# Patient Record
Sex: Female | Born: 1945
Health system: Southern US, Community
[De-identification: ages and names within clinical notes are randomized; demographics above are authoritative.]

## PROBLEM LIST (undated history)

## (undated) DIAGNOSIS — E039 Hypothyroidism, unspecified: Secondary | ICD-10-CM

## (undated) DIAGNOSIS — C801 Malignant (primary) neoplasm, unspecified: Secondary | ICD-10-CM

## (undated) DIAGNOSIS — M1990 Unspecified osteoarthritis, unspecified site: Secondary | ICD-10-CM

## (undated) DIAGNOSIS — Z87442 Personal history of urinary calculi: Secondary | ICD-10-CM

## (undated) DIAGNOSIS — F32A Depression, unspecified: Secondary | ICD-10-CM

## (undated) DIAGNOSIS — M810 Age-related osteoporosis without current pathological fracture: Secondary | ICD-10-CM

## (undated) DIAGNOSIS — D649 Anemia, unspecified: Secondary | ICD-10-CM

## (undated) DIAGNOSIS — C4490 Unspecified malignant neoplasm of skin, unspecified: Secondary | ICD-10-CM

## (undated) DIAGNOSIS — K219 Gastro-esophageal reflux disease without esophagitis: Secondary | ICD-10-CM

## (undated) DIAGNOSIS — Z800 Family history of malignant neoplasm of digestive organs: Secondary | ICD-10-CM

## (undated) DIAGNOSIS — G43909 Migraine, unspecified, not intractable, without status migrainosus: Secondary | ICD-10-CM

## (undated) DIAGNOSIS — L900 Lichen sclerosus et atrophicus: Secondary | ICD-10-CM

## (undated) DIAGNOSIS — F329 Major depressive disorder, single episode, unspecified: Secondary | ICD-10-CM

## (undated) DIAGNOSIS — T7840XA Allergy, unspecified, initial encounter: Secondary | ICD-10-CM

## (undated) DIAGNOSIS — E7800 Pure hypercholesterolemia, unspecified: Secondary | ICD-10-CM

## (undated) DIAGNOSIS — D126 Benign neoplasm of colon, unspecified: Secondary | ICD-10-CM

## (undated) DIAGNOSIS — B009 Herpesviral infection, unspecified: Secondary | ICD-10-CM

## (undated) DIAGNOSIS — Z803 Family history of malignant neoplasm of breast: Secondary | ICD-10-CM

## (undated) DIAGNOSIS — J189 Pneumonia, unspecified organism: Secondary | ICD-10-CM

## (undated) DIAGNOSIS — Z8042 Family history of malignant neoplasm of prostate: Secondary | ICD-10-CM

## (undated) DIAGNOSIS — Z85820 Personal history of malignant melanoma of skin: Secondary | ICD-10-CM

## (undated) DIAGNOSIS — M8580 Other specified disorders of bone density and structure, unspecified site: Secondary | ICD-10-CM

## (undated) DIAGNOSIS — F419 Anxiety disorder, unspecified: Secondary | ICD-10-CM

## (undated) HISTORY — PX: MELANOMA EXCISION WITH SENTINEL LYMPH NODE BIOPSY: SHX5267

## (undated) HISTORY — DX: Personal history of urinary calculi: Z87.442

## (undated) HISTORY — DX: Anxiety disorder, unspecified: F41.9

## (undated) HISTORY — PX: OTHER SURGICAL HISTORY: SHX169

## (undated) HISTORY — DX: Lichen sclerosus et atrophicus: L90.0

## (undated) HISTORY — DX: Anemia, unspecified: D64.9

## (undated) HISTORY — DX: Allergy, unspecified, initial encounter: T78.40XA

## (undated) HISTORY — DX: Pure hypercholesterolemia, unspecified: E78.00

## (undated) HISTORY — DX: Family history of malignant neoplasm of digestive organs: Z80.0

## (undated) HISTORY — DX: Herpesviral infection, unspecified: B00.9

## (undated) HISTORY — DX: Unspecified malignant neoplasm of skin, unspecified: C44.90

## (undated) HISTORY — DX: Pneumonia, unspecified organism: J18.9

## (undated) HISTORY — DX: Family history of malignant neoplasm of breast: Z80.3

## (undated) HISTORY — DX: Gastro-esophageal reflux disease without esophagitis: K21.9

## (undated) HISTORY — DX: Family history of malignant neoplasm of prostate: Z80.42

## (undated) HISTORY — DX: Hypothyroidism, unspecified: E03.9

## (undated) HISTORY — DX: Migraine, unspecified, not intractable, without status migrainosus: G43.909

## (undated) HISTORY — PX: BREAST EXCISIONAL BIOPSY: SUR124

## (undated) HISTORY — DX: Malignant (primary) neoplasm, unspecified: C80.1

## (undated) HISTORY — DX: Depression, unspecified: F32.A

## (undated) HISTORY — DX: Major depressive disorder, single episode, unspecified: F32.9

## (undated) HISTORY — DX: Personal history of malignant melanoma of skin: Z85.820

## (undated) HISTORY — DX: Unspecified osteoarthritis, unspecified site: M19.90

## (undated) HISTORY — DX: Benign neoplasm of colon, unspecified: D12.6

## (undated) HISTORY — DX: Age-related osteoporosis without current pathological fracture: M81.0

## (undated) HISTORY — DX: Other specified disorders of bone density and structure, unspecified site: M85.80

---

## 1988-06-22 HISTORY — PX: VAGINAL HYSTERECTOMY: SUR661

## 1997-11-21 ENCOUNTER — Other Ambulatory Visit: Admission: RE | Admit: 1997-11-21 | Discharge: 1997-11-21 | Payer: Self-pay | Admitting: Obstetrics and Gynecology

## 1999-05-06 ENCOUNTER — Other Ambulatory Visit: Admission: RE | Admit: 1999-05-06 | Discharge: 1999-05-06 | Payer: Self-pay | Admitting: Obstetrics and Gynecology

## 1999-11-17 ENCOUNTER — Encounter: Payer: Self-pay | Admitting: Obstetrics and Gynecology

## 1999-11-17 ENCOUNTER — Encounter: Admission: RE | Admit: 1999-11-17 | Discharge: 1999-11-17 | Payer: Self-pay | Admitting: Obstetrics and Gynecology

## 1999-12-01 ENCOUNTER — Encounter: Admission: RE | Admit: 1999-12-01 | Discharge: 1999-12-01 | Payer: Self-pay | Admitting: Obstetrics and Gynecology

## 2000-07-21 ENCOUNTER — Other Ambulatory Visit: Admission: RE | Admit: 2000-07-21 | Discharge: 2000-07-21 | Payer: Self-pay | Admitting: Obstetrics and Gynecology

## 2000-08-06 ENCOUNTER — Encounter: Admission: RE | Admit: 2000-08-06 | Discharge: 2000-08-06 | Payer: Self-pay | Admitting: Obstetrics and Gynecology

## 2000-08-06 ENCOUNTER — Encounter: Payer: Self-pay | Admitting: Obstetrics and Gynecology

## 2000-09-27 ENCOUNTER — Ambulatory Visit (HOSPITAL_COMMUNITY): Admission: RE | Admit: 2000-09-27 | Discharge: 2000-09-27 | Payer: Self-pay

## 2000-09-27 ENCOUNTER — Encounter: Payer: Self-pay | Admitting: Emergency Medicine

## 2001-03-11 ENCOUNTER — Encounter: Payer: Self-pay | Admitting: Obstetrics and Gynecology

## 2001-03-11 ENCOUNTER — Encounter: Admission: RE | Admit: 2001-03-11 | Discharge: 2001-03-11 | Payer: Self-pay | Admitting: Obstetrics and Gynecology

## 2001-07-21 ENCOUNTER — Other Ambulatory Visit: Admission: RE | Admit: 2001-07-21 | Discharge: 2001-07-21 | Payer: Self-pay | Admitting: Obstetrics and Gynecology

## 2002-07-25 ENCOUNTER — Other Ambulatory Visit: Admission: RE | Admit: 2002-07-25 | Discharge: 2002-07-25 | Payer: Self-pay | Admitting: Obstetrics and Gynecology

## 2002-08-14 ENCOUNTER — Encounter: Admission: RE | Admit: 2002-08-14 | Discharge: 2002-08-14 | Payer: Self-pay | Admitting: Obstetrics and Gynecology

## 2002-08-14 ENCOUNTER — Encounter: Payer: Self-pay | Admitting: Obstetrics and Gynecology

## 2002-08-17 ENCOUNTER — Encounter: Admission: RE | Admit: 2002-08-17 | Discharge: 2002-08-17 | Payer: Self-pay | Admitting: Obstetrics and Gynecology

## 2002-08-17 ENCOUNTER — Encounter: Payer: Self-pay | Admitting: Obstetrics and Gynecology

## 2003-08-03 ENCOUNTER — Other Ambulatory Visit: Admission: RE | Admit: 2003-08-03 | Discharge: 2003-08-03 | Payer: Self-pay | Admitting: Obstetrics and Gynecology

## 2004-02-12 ENCOUNTER — Encounter: Admission: RE | Admit: 2004-02-12 | Discharge: 2004-02-12 | Payer: Self-pay | Admitting: Obstetrics and Gynecology

## 2004-08-25 ENCOUNTER — Other Ambulatory Visit: Admission: RE | Admit: 2004-08-25 | Discharge: 2004-08-25 | Payer: Self-pay | Admitting: Addiction Medicine

## 2004-09-04 ENCOUNTER — Encounter: Admission: RE | Admit: 2004-09-04 | Discharge: 2004-09-04 | Payer: Self-pay | Admitting: Obstetrics and Gynecology

## 2005-08-21 ENCOUNTER — Encounter: Admission: RE | Admit: 2005-08-21 | Discharge: 2005-08-21 | Payer: Self-pay | Admitting: Family Medicine

## 2005-10-29 ENCOUNTER — Other Ambulatory Visit: Admission: RE | Admit: 2005-10-29 | Discharge: 2005-10-29 | Payer: Self-pay | Admitting: Obstetrics and Gynecology

## 2005-12-09 ENCOUNTER — Encounter: Admission: RE | Admit: 2005-12-09 | Discharge: 2005-12-09 | Payer: Self-pay | Admitting: Obstetrics and Gynecology

## 2005-12-22 ENCOUNTER — Encounter: Admission: RE | Admit: 2005-12-22 | Discharge: 2005-12-22 | Payer: Self-pay | Admitting: Obstetrics and Gynecology

## 2006-12-02 ENCOUNTER — Other Ambulatory Visit: Admission: RE | Admit: 2006-12-02 | Discharge: 2006-12-02 | Payer: Self-pay | Admitting: Obstetrics and Gynecology

## 2007-02-14 ENCOUNTER — Encounter: Admission: RE | Admit: 2007-02-14 | Discharge: 2007-02-14 | Payer: Self-pay | Admitting: Obstetrics and Gynecology

## 2007-12-06 ENCOUNTER — Other Ambulatory Visit: Admission: RE | Admit: 2007-12-06 | Discharge: 2007-12-06 | Payer: Self-pay | Admitting: Obstetrics and Gynecology

## 2007-12-29 ENCOUNTER — Ambulatory Visit: Payer: Self-pay | Admitting: Gastroenterology

## 2008-01-12 ENCOUNTER — Ambulatory Visit: Payer: Self-pay | Admitting: Gastroenterology

## 2008-01-12 ENCOUNTER — Encounter: Payer: Self-pay | Admitting: Gastroenterology

## 2008-01-13 ENCOUNTER — Encounter: Payer: Self-pay | Admitting: Gastroenterology

## 2008-02-24 ENCOUNTER — Encounter: Admission: RE | Admit: 2008-02-24 | Discharge: 2008-02-24 | Payer: Self-pay | Admitting: Obstetrics and Gynecology

## 2008-12-10 ENCOUNTER — Encounter: Payer: Self-pay | Admitting: Obstetrics and Gynecology

## 2008-12-10 ENCOUNTER — Ambulatory Visit: Payer: Self-pay | Admitting: Obstetrics and Gynecology

## 2008-12-10 ENCOUNTER — Other Ambulatory Visit: Admission: RE | Admit: 2008-12-10 | Discharge: 2008-12-10 | Payer: Self-pay | Admitting: Obstetrics and Gynecology

## 2009-04-12 ENCOUNTER — Encounter: Admission: RE | Admit: 2009-04-12 | Discharge: 2009-04-12 | Payer: Self-pay | Admitting: Obstetrics and Gynecology

## 2010-05-21 ENCOUNTER — Encounter: Admission: RE | Admit: 2010-05-21 | Discharge: 2010-05-21 | Payer: Self-pay | Admitting: Obstetrics and Gynecology

## 2010-06-04 ENCOUNTER — Other Ambulatory Visit
Admission: RE | Admit: 2010-06-04 | Discharge: 2010-06-04 | Payer: Self-pay | Source: Home / Self Care | Admitting: Obstetrics and Gynecology

## 2010-06-04 ENCOUNTER — Ambulatory Visit: Payer: Self-pay | Admitting: Obstetrics and Gynecology

## 2010-06-09 ENCOUNTER — Encounter
Admission: RE | Admit: 2010-06-09 | Discharge: 2010-06-09 | Payer: Self-pay | Source: Home / Self Care | Attending: Obstetrics and Gynecology | Admitting: Obstetrics and Gynecology

## 2010-07-13 ENCOUNTER — Encounter: Payer: Self-pay | Admitting: Obstetrics and Gynecology

## 2011-04-14 ENCOUNTER — Other Ambulatory Visit: Payer: Self-pay | Admitting: Obstetrics and Gynecology

## 2011-04-14 DIAGNOSIS — Z1231 Encounter for screening mammogram for malignant neoplasm of breast: Secondary | ICD-10-CM

## 2011-05-20 ENCOUNTER — Encounter: Payer: Self-pay | Admitting: *Deleted

## 2011-05-20 DIAGNOSIS — E039 Hypothyroidism, unspecified: Secondary | ICD-10-CM | POA: Insufficient documentation

## 2011-05-20 DIAGNOSIS — E7800 Pure hypercholesterolemia, unspecified: Secondary | ICD-10-CM | POA: Insufficient documentation

## 2011-05-20 DIAGNOSIS — L900 Lichen sclerosus et atrophicus: Secondary | ICD-10-CM | POA: Insufficient documentation

## 2011-05-20 DIAGNOSIS — M8580 Other specified disorders of bone density and structure, unspecified site: Secondary | ICD-10-CM | POA: Insufficient documentation

## 2011-05-25 ENCOUNTER — Ambulatory Visit
Admission: RE | Admit: 2011-05-25 | Discharge: 2011-05-25 | Disposition: A | Discharge status: Home / Self Care | Payer: BC Managed Care – PPO | Source: Ambulatory Visit | Attending: Obstetrics and Gynecology | Admitting: Obstetrics and Gynecology

## 2011-05-25 DIAGNOSIS — Z1231 Encounter for screening mammogram for malignant neoplasm of breast: Secondary | ICD-10-CM

## 2011-06-09 ENCOUNTER — Ambulatory Visit (INDEPENDENT_AMBULATORY_CARE_PROVIDER_SITE_OTHER): Payer: BC Managed Care – PPO | Admitting: Obstetrics and Gynecology

## 2011-06-09 ENCOUNTER — Encounter: Payer: Self-pay | Admitting: Obstetrics and Gynecology

## 2011-06-09 ENCOUNTER — Other Ambulatory Visit (HOSPITAL_COMMUNITY)
Admission: RE | Admit: 2011-06-09 | Discharge: 2011-06-09 | Disposition: A | Discharge status: Home / Self Care | Payer: BC Managed Care – PPO | Source: Ambulatory Visit | Attending: Obstetrics and Gynecology | Admitting: Obstetrics and Gynecology

## 2011-06-09 VITALS — BP 120/74 | Ht 59.0 in | Wt 106.0 lb

## 2011-06-09 DIAGNOSIS — Z01419 Encounter for gynecological examination (general) (routine) without abnormal findings: Secondary | ICD-10-CM | POA: Insufficient documentation

## 2011-06-09 MED ORDER — ESTRADIOL 1 MG PO TABS: 1.0000 mg | ORAL_TABLET | Freq: Every day | ORAL | Status: DC

## 2011-06-09 NOTE — Progress Notes (Signed)
Patient came to see me today for her annual GYN exam. She remains on oral estradiol with excellent results. She has minimal bone loss on bone density. She remains on calcium and vitamin D. She is up-to-date on her mammograms. She is having no vaginal bleeding or pelvic pain. She has noticed recently occasional dizziness when she's in bed and some fatigue. She also has a mark where a mole was removed from her right breast by a dermatologist with a negative report. She does her lab through her PCP.  HEENT: Within normal limits.  Kennon Portela present. Neck: No masses. Supraclavicular lymph nodes: Not enlarged. Breasts: Examined in both sitting and lying position. Symmetrical without skin changes or masses. Next to a scar from a mole excision there is a red raised lesion which looks like a keloid. It is less than half a centimeter. Abdomen: Soft no masses guarding or rebound. No hernias. Pelvic: External within normal limits. BUS within normal limits.there is now just minimal lichen sclerosis present. vaginal examination shows good estrogen effect, no cystocele enterocele or rectocele. Cervix and uterus absent. Adnexa within normal limits. Rectovaginal confirmatory. Extremities within normal limits.  Assessment: Menopausal symptoms. Lichen sclerosis much improved. Osteopenia. Probable keloid from all excision.  Plan: Continue estradiol 1.5 mg daily. Continuing mammograms. Refer to Dr. Venancio Poisson for further dermatological care. Prefer to Dr. Shon Hough for possible excision of keloid.

## 2011-10-23 ENCOUNTER — Other Ambulatory Visit: Payer: Self-pay | Admitting: Obstetrics and Gynecology

## 2012-04-19 ENCOUNTER — Other Ambulatory Visit: Payer: Self-pay | Admitting: Obstetrics and Gynecology

## 2012-04-19 DIAGNOSIS — Z1231 Encounter for screening mammogram for malignant neoplasm of breast: Secondary | ICD-10-CM

## 2012-05-23 ENCOUNTER — Ambulatory Visit
Admission: RE | Admit: 2012-05-23 | Discharge: 2012-05-23 | Disposition: A | Discharge status: Home / Self Care | Payer: Managed Care, Other (non HMO) | Source: Ambulatory Visit | Attending: Obstetrics and Gynecology | Admitting: Obstetrics and Gynecology

## 2012-05-23 DIAGNOSIS — Z1231 Encounter for screening mammogram for malignant neoplasm of breast: Secondary | ICD-10-CM

## 2012-06-16 ENCOUNTER — Ambulatory Visit (INDEPENDENT_AMBULATORY_CARE_PROVIDER_SITE_OTHER): Payer: Managed Care, Other (non HMO) | Admitting: Obstetrics and Gynecology

## 2012-06-16 ENCOUNTER — Encounter: Payer: Self-pay | Admitting: Obstetrics and Gynecology

## 2012-06-16 VITALS — BP 122/72 | Ht 59.0 in | Wt 106.0 lb

## 2012-06-16 DIAGNOSIS — Z01419 Encounter for gynecological examination (general) (routine) without abnormal findings: Secondary | ICD-10-CM

## 2012-06-16 MED ORDER — ESTRADIOL 1 MG PO TABS: 1.0000 mg | ORAL_TABLET | Freq: Every day | ORAL | Status: DC

## 2012-06-16 MED ORDER — VITAMIN D (ERGOCALCIFEROL) 1.25 MG (50000 UNIT) PO CAPS: ORAL_CAPSULE | ORAL | Status: DC

## 2012-06-16 NOTE — Patient Instructions (Signed)
Get a 3-D mammogram next year. Get a bone density next year.

## 2012-06-16 NOTE — Progress Notes (Signed)
Patient came to see me today for her annual GYN exam. She remains on oral estradiol 1.5 mg daily. She has  Tried  to taper this unsuccessfully. She has no menopausal symptoms or atrophic vaginitis on the above. She is up-to-date on mammograms. She has a strong family history of breast cancer. She has osteopenia on bone density which is stable. Her last bone density was 2011. She has a long-term history of lichen sclerosis. She uses Temovate but now very infrequently. She had a vaginal hysterectomy in 1990 for adenomyosis. She has always had normal Pap smears. Her last Pap smear was 2012. She takes vitamin D 50,000 IUs every other week.  HEENT: Within normal limits.Kennon Portela present. Neck: No masses. Supraclavicular lymph nodes: Not enlarged. Breasts: Examined in both sitting and lying position. Symmetrical without skin changes or masses. Abdomen: Soft no masses guarding or rebound. No hernias. Pelvic: External within normal limits with minimal lichens sclerosis of labia and perineum-better than before. BUS within normal limits. Vaginal examination shows good estrogen effect, no cystocele enterocele or rectocele. Cervix and uterus absent. Adnexa within normal limits. Rectovaginal confirmatory. Extremities within normal limits.  Assessment: #1. Lichen sclerosis. #2. Menopausal symptoms. #3. Osteopenia  Plan: Continue yearly mammograms. Bone density next year. Continue vitamin D. Patient to get Dr. Lenise Arena to recheck her vitamin D level. Encourage patient to switch to estrogen patch. She declined. Continue oral  Estradiol. Pap not done. The new Pap smear guidelines were discussed with the patient.

## 2012-06-16 NOTE — Addendum Note (Signed)
Addended by: Dayna Barker on: 06/16/2012 10:26 AM   Modules accepted: Orders

## 2012-06-17 LAB — URINALYSIS W MICROSCOPIC + REFLEX CULTURE
Bilirubin Urine: NEGATIVE
Hgb urine dipstick: NEGATIVE
Ketones, ur: NEGATIVE mg/dL
Protein, ur: NEGATIVE mg/dL
Specific Gravity, Urine: 1.007 (ref 1.005–1.030)

## 2013-01-04 ENCOUNTER — Encounter: Payer: Self-pay | Admitting: Gastroenterology

## 2013-01-20 DIAGNOSIS — D126 Benign neoplasm of colon, unspecified: Secondary | ICD-10-CM

## 2013-01-20 HISTORY — DX: Benign neoplasm of colon, unspecified: D12.6

## 2013-01-25 ENCOUNTER — Ambulatory Visit (AMBULATORY_SURGERY_CENTER): Payer: Medicare Other

## 2013-01-25 VITALS — Ht 59.5 in | Wt 109.0 lb

## 2013-01-25 DIAGNOSIS — Z860100 Personal history of colon polyps, unspecified: Secondary | ICD-10-CM

## 2013-01-25 MED ORDER — MOVIPREP 100 G PO SOLR: 1.0000 | Freq: Once | ORAL | Status: DC

## 2013-01-26 ENCOUNTER — Encounter: Payer: Self-pay | Admitting: Gastroenterology

## 2013-02-02 ENCOUNTER — Encounter: Payer: Self-pay | Admitting: Gastroenterology

## 2013-02-02 ENCOUNTER — Ambulatory Visit (AMBULATORY_SURGERY_CENTER): Payer: Medicare Other | Admitting: Gastroenterology

## 2013-02-02 VITALS — BP 129/57 | HR 74 | Temp 99.3°F | Resp 15 | Ht 59.5 in | Wt 109.0 lb

## 2013-02-02 DIAGNOSIS — Z800 Family history of malignant neoplasm of digestive organs: Secondary | ICD-10-CM

## 2013-02-02 DIAGNOSIS — D126 Benign neoplasm of colon, unspecified: Secondary | ICD-10-CM

## 2013-02-02 DIAGNOSIS — Z860100 Personal history of colon polyps, unspecified: Secondary | ICD-10-CM

## 2013-02-02 MED ORDER — SODIUM CHLORIDE 0.9 % IV SOLN: 500.0000 mL | INTRAVENOUS | Status: DC

## 2013-02-02 NOTE — Progress Notes (Signed)
Called to room to assist during endoscopic procedure.  Patient ID and intended procedure confirmed with present staff. Received instructions for my participation in the procedure from the performing physician. ewm 

## 2013-02-02 NOTE — Patient Instructions (Addendum)
YOU HAD AN ENDOSCOPIC PROCEDURE TODAY AT THE [facility]: Refer to the procedure report that was given to you for any specific questions about what was found during the examination.  If the procedure report does not answer your questions, please call your gastroenterologist to clarify.  If you requested that your care partner not be given the details of your procedure findings, then the procedure report has been included in a sealed envelope for you to review at your convenience later.  YOU SHOULD EXPECT: Some feelings of bloating in the abdomen. Passage of more gas than usual.  Walking can help get rid of the air that was put into your GI tract during the procedure and reduce the bloating. If you had a lower endoscopy (such as a colonoscopy or flexible sigmoidoscopy) you may notice spotting of blood in your stool or on the toilet paper. If you underwent a bowel prep for your procedure, then you may not have a normal bowel movement for a few days.  DIET: Your first meal following the procedure should be a light meal and then it is ok to progress to your normal diet.  A half-sandwich or bowl of soup is an example of a good first meal.  Heavy or fried foods are harder to digest and may make you feel nauseous or bloated.  Likewise meals heavy in dairy and vegetables can cause extra gas to form and this can also increase the bloating.  Drink plenty of fluids but you should avoid alcoholic beverages for 24 hours.  ACTIVITY: Your care partner should take you home directly after the procedure.  You should plan to take it easy, moving slowly for the rest of the day.  You can resume normal activity the day after the procedure however you should NOT DRIVE or use heavy machinery for 24 hours (because of the sedation medicines used during the test).    SYMPTOMS TO REPORT IMMEDIATELY: A gastroenterologist can be reached at any hour.  During normal business hours, 8:30 AM to 5:00 PM Monday through Friday,  call [phone].  After hours and on weekends, please call the GI answering service at [phone] who will take a message and have the physician on call contact you.   Following lower endoscopy (colonoscopy or flexible sigmoidoscopy):  Excessive amounts of blood in the stool  Significant tenderness or worsening of abdominal pains  Swelling of the abdomen that is new, acute  Fever of 100F or higher    FOLLOW UP: If any biopsies were taken you will be contacted by phone or by letter within the next 1-3 weeks.  Call your gastroenterologist if you have not heard about the biopsies in 3 weeks.  Our staff will call the home number listed on your records the next business day following your procedure to check on you and address any questions or concerns that you may have at that time regarding the information given to you following your procedure. This is a courtesy call and so if there is no answer at the home number and we have not heard from you through the emergency physician on call, we will assume that you have returned to your regular daily activities without incident.  SIGNATURES/CONFIDENTIALITY: You and/or your care partner have signed paperwork which will be entered into your electronic medical record.  These signatures attest to the fact that that the information above on your After Visit Summary has been reviewed and is understood.  Full responsibility of the confidentiality   of this discharge information lies with you and/or your care-partner.   Handouts were given to your care partner on polyps, hemorrhoids and a high fiber diet with liberal fluid intake. You may resume your current medications today. Please call if any questions of concerns.

## 2013-02-02 NOTE — Progress Notes (Signed)
Lidocaine-40mg IV prior to Propofol InductionPropofol given over incremental dosages 

## 2013-02-02 NOTE — Op Note (Signed)
[facility] [address] [place], [address]   COLONOSCOPY PROCEDURE REPORT  PATIENT: Carla Alvarado, Carla Alvarado  MR#: [id number] BIRTHDATE: 04/09/1946 , 67  yrs. old GENDER: Female ENDOSCOPIST: Meryl Dare, MD, [facility] PROCEDURE DATE:  02/02/2013 PROCEDURE:   Colonoscopy with biopsy and snare polypectomy First Screening Colonoscopy - Avg.  risk and is 50 yrs.  old or older - No.  Prior Negative Screening - Now for repeat screening. N/A  History of Adenoma - Now for follow-up colonoscopy & has been > or = to 3 yrs.  Yes hx of adenoma.  Has been 3 or more years since last colonoscopy.  Polyps Removed Today? Yes. ASA CLASS:   Class II INDICATIONS:Patient's personal history of adenomatous colon polyps and Patient's immediate family history of colon cancer. MEDICATIONS: MAC sedation, administered by CRNA and propofol (Diprivan) 200mg  IV DESCRIPTION OF PROCEDURE:   After the risks benefits and alternatives of the procedure were thoroughly explained, informed consent was obtained.  A digital rectal exam revealed no abnormalities of the rectum.   The LB [id number]  endoscope was introduced through the anus and advanced to the cecum, which was identified by both the appendix and ileocecal valve. No adverse events experienced.   The quality of the prep was excellent, using MoviPrep  The instrument was then slowly withdrawn as the colon was fully examined.  COLON FINDINGS: A sessile polyp measuring 6 mm in size was found in the descending colon.  A polypectomy was performed with a cold snare.  The resection was complete and the polyp tissue was completely retrieved.  Two sessile polyps measuring 4 mm in size were found in the sigmoid colon.  A polypectomy was performed with cold forceps.  The resection was complete and the polyp tissue was completely retrieved.  The colon was otherwise normal. There was no diverticulosis, inflammation, polyps or cancers unless  previously stated. Retroflexed views revealed small internal hemorrhoids. The time to cecum=1 minutes 45 seconds. Withdrawal time=12 minutes 00 seconds.  The scope was withdrawn and the procedure completed. COMPLICATIONS: There were no complications.  ENDOSCOPIC IMPRESSION: 1.   Sessile polyp measuring 6 mm in the descending colon; polypectomy performed with a cold snare 2.   Two sessile polyps measuring 4 mm in the sigmoid colon; polypectomy performed with cold forceps 3.   Small internal hemorrhoids  RECOMMENDATIONS: 1.  Await pathology results 2.  Repeat Colonoscopy in 5 years.  eSigned:  Meryl Dare, MD, [facility] 02/02/2013 3:13 PM   cc: Joycelyn Rua, MD

## 2013-02-02 NOTE — Progress Notes (Signed)
Patient did not experience any of the following events: a burn prior to discharge; a fall within the facility; wrong site/side/patient/procedure/implant event; or a hospital transfer or hospital admission upon discharge from the facility. ([id number])Patient did not have preoperative order for IV antibiotic SSI prophylaxis. [phone])   No complaints noted in the recovery room. Maw

## 2013-02-03 ENCOUNTER — Telehealth: Payer: Self-pay

## 2013-02-03 NOTE — Telephone Encounter (Signed)
  Follow up Call-  Call back number 02/02/2013  Post procedure Call Back phone  # [phone]  Permission to leave phone message Yes     Patient questions:  Do you have a fever, pain , or abdominal swelling? no Pain Score  0 *  Have you tolerated food without any problems? yes  Have you been able to return to your normal activities? yes  Do you have any questions about your discharge instructions: Diet   no Medications  no Follow up visit  no  Do you have questions or concerns about your Care? no  Actions: * If pain score is 4 or above: No action needed, pain <4.

## 2013-02-07 ENCOUNTER — Encounter: Payer: Self-pay | Admitting: Gastroenterology

## 2013-04-19 ENCOUNTER — Other Ambulatory Visit: Payer: Self-pay

## 2013-04-19 DIAGNOSIS — Z803 Family history of malignant neoplasm of breast: Secondary | ICD-10-CM

## 2013-04-19 DIAGNOSIS — Z1231 Encounter for screening mammogram for malignant neoplasm of breast: Secondary | ICD-10-CM

## 2013-05-25 ENCOUNTER — Ambulatory Visit: Payer: Medicare Other

## 2013-05-30 ENCOUNTER — Ambulatory Visit
Admission: RE | Admit: 2013-05-30 | Discharge: 2013-05-30 | Disposition: A | Discharge status: Home / Self Care | Payer: Medicare Other | Source: Ambulatory Visit

## 2013-05-30 DIAGNOSIS — Z803 Family history of malignant neoplasm of breast: Secondary | ICD-10-CM

## 2013-05-30 DIAGNOSIS — Z1231 Encounter for screening mammogram for malignant neoplasm of breast: Secondary | ICD-10-CM

## 2013-06-19 ENCOUNTER — Ambulatory Visit (INDEPENDENT_AMBULATORY_CARE_PROVIDER_SITE_OTHER): Payer: Medicare Other | Admitting: Gynecology

## 2013-06-19 ENCOUNTER — Encounter: Payer: Self-pay | Admitting: Gynecology

## 2013-06-19 VITALS — BP 112/62 | Ht 59.5 in | Wt 112.2 lb

## 2013-06-19 DIAGNOSIS — Z79890 Hormone replacement therapy (postmenopausal): Secondary | ICD-10-CM

## 2013-06-19 DIAGNOSIS — L900 Lichen sclerosus et atrophicus: Secondary | ICD-10-CM

## 2013-06-19 DIAGNOSIS — M899 Disorder of bone, unspecified: Secondary | ICD-10-CM

## 2013-06-19 DIAGNOSIS — L940 Localized scleroderma [morphea]: Secondary | ICD-10-CM

## 2013-06-19 DIAGNOSIS — N952 Postmenopausal atrophic vaginitis: Secondary | ICD-10-CM

## 2013-06-19 DIAGNOSIS — M8580 Other specified disorders of bone density and structure, unspecified site: Secondary | ICD-10-CM

## 2013-06-19 MED ORDER — ESTRADIOL 1 MG PO TABS: 1.0000 mg | ORAL_TABLET | Freq: Every day | ORAL | Status: DC

## 2013-06-19 NOTE — Progress Notes (Signed)
Carla Alvarado [birth year] [id number]        67 y.o.  [id number] for followup exam.  Former patient of Dr. Eda Paschal. Several issues noted below.   Past medical history,surgical history, problem list, medications, allergies, family history and social history were all reviewed and documented in the EPIC chart.  ROS:  Performed and pertinent positives and negatives are included in the history, assessment and plan .  Exam: Carla Alvarado assistant Filed Vitals:   06/19/13 0846  BP: 112/62  Height: 4' 11.5" (1.511 m)  Weight: 112 lb 3.2 oz (50.894 kg)   General appearance  Normal Skin grossly normal Head/Neck normal with no cervical or supraclavicular adenopathy thyroid normal Lungs  clear Cardiac RR, without RMG Abdominal  soft, nontender, without masses, organomegaly or hernia Breasts  examined lying and sitting without masses, retractions, discharge or axillary adenopathy. Pelvic  Ext/BUS/vagina  with symmetrical hypopigmentation from periclitoral to perianal region consistent with lichen sclerosus. Generalized atrophic changes.  Adnexa  Without masses or tenderness    Anus and perineum  Normal   Rectovaginal  Normal sphincter tone without palpated masses or tenderness.    Assessment/Plan:  67 y.o. [id number] female for annual exam.   1. Status post TVH in 1990 for adenomyosis. On estradiol 1.5 mg daily. Started for menopausal symptoms. Had tried to wean but had unacceptable hot flushes and night sweats.  I reviewed the whole issue of HRT with her to include the WHI study with increased risk of stroke, heart attack, DVT and breast cancer. The ACOG and NAMS statements for lowest dose for the shortest period of time reviewed. Transdermal versus oral first-pass effect benefit discussed.  After lengthy discussion the patient wants to continue. I did recommend going to 1 mg daily to see if she tolerates this and ultimately try to wean slowly. Patient agrees to do so. I refilled her ERT x1 year. 2. Lichen  sclerosus. Documented in Dr. Verl Dicker note. Exam consistent with this. Had used Temovate in the past but no longer requires this for symptom control. Will call if become symptomatic. 3. Osteopenia. DEXA 2011 with T score -1.3. Recommend repeat DEXA now she agrees to schedule. Had been on vitamin D 50,000 units every other week per Dr. Eda Paschal but she does admit to not taking regularly. Has not had vitamin D level checked for a long time. Check vitamin D level today. Will recommend supplement based on this. Did not refill her 50,000 units prescription. 4. Pap smear 2012. No Pap smear done today. No history of significant abnormal Pap smears. Age 89. Hysterectomy for benign indications. Options to stop screening altogether or less frequent screening intervals reviewed. We'll readdress on an annual basis. 5. Mammography 05/2013. Continue with annual mammography. SBE monthly reviewed. 6. Colonoscopy 01/2013. Repeat that they're recommend that interval. 7. Health maintenance. No routine blood work done this this is all done through her primary physician's office who follows her for her medical issues. Followup one year, sooner as needed.   Note: This document was prepared with digital dictation and possible smart phrase technology. Any transcriptional errors that result from this process are unintentional.   Carla Lords MD, 9:21 AM 06/19/2013

## 2013-06-19 NOTE — Patient Instructions (Signed)
Followup for bone density as scheduled. Followup in one year, sooner as needed.

## 2013-06-20 LAB — URINALYSIS W MICROSCOPIC + REFLEX CULTURE
Bilirubin Urine: NEGATIVE
Leukocytes, UA: NEGATIVE
Nitrite: NEGATIVE
Protein, ur: NEGATIVE mg/dL
Urobilinogen, UA: 0.2 mg/dL (ref 0.0–1.0)

## 2013-08-08 ENCOUNTER — Ambulatory Visit: Payer: Medicare Other | Admitting: Family Medicine

## 2013-08-20 DIAGNOSIS — M8580 Other specified disorders of bone density and structure, unspecified site: Secondary | ICD-10-CM

## 2013-08-20 HISTORY — DX: Other specified disorders of bone density and structure, unspecified site: M85.80

## 2013-09-07 ENCOUNTER — Ambulatory Visit (INDEPENDENT_AMBULATORY_CARE_PROVIDER_SITE_OTHER): Payer: Medicare Other

## 2013-09-07 ENCOUNTER — Encounter: Payer: Self-pay | Admitting: Gynecology

## 2013-09-07 DIAGNOSIS — M8580 Other specified disorders of bone density and structure, unspecified site: Secondary | ICD-10-CM

## 2013-09-07 DIAGNOSIS — M899 Disorder of bone, unspecified: Secondary | ICD-10-CM

## 2013-09-07 DIAGNOSIS — M949 Disorder of cartilage, unspecified: Secondary | ICD-10-CM

## 2013-10-19 ENCOUNTER — Ambulatory Visit: Payer: Medicare Other | Admitting: Family Medicine

## 2014-01-18 ENCOUNTER — Ambulatory Visit (INDEPENDENT_AMBULATORY_CARE_PROVIDER_SITE_OTHER): Payer: Medicare Other | Admitting: Neurology

## 2014-01-18 ENCOUNTER — Encounter: Payer: Self-pay | Admitting: Neurology

## 2014-01-18 VITALS — BP 94/64 | HR 72 | Ht 59.0 in | Wt 110.0 lb

## 2014-01-18 DIAGNOSIS — R251 Tremor, unspecified: Secondary | ICD-10-CM

## 2014-01-18 DIAGNOSIS — R259 Unspecified abnormal involuntary movements: Secondary | ICD-10-CM

## 2014-01-18 NOTE — Progress Notes (Signed)
[facility]    Provider:  Dr Janann Colonel Referring Provider: Orpah Melter, MD Primary Care Physician:  Carla Melter, MD  CC:  tremor  HPI:  Carla Alvarado is a 68 y.o. female here as a referral from Dr. Olen Alvarado for tremor evaluation  First noted around 3 months ago. Is mainly a rest tremor, notes it the most when she is holding her cell phone in her hand. Notes some impaired mobility in her left hand. Notes decreased strength in her left hand. Notes no slowing down of her walking, does note some gait instability. No muscle cramps or spasms. No known REM behavior disorder. No known family history of tremor or neurodegenerative processes.   Lately started having some dizzy episodes and light headed sensation. Dizziness is described as more of a vertigo sensation. Typically will last a few seconds but recently occurred multiple times over a few minutes. Believes it was triggered by turning her head a certain direction. Went and laid down and it resolved. Had similar episode around one year ago which was even worse.   No history of HTN, DM, HLD.   Review of Systems: Out of a complete 14 system review, the patient complains of only the following symptoms, and all other reviewed systems are negative. + loss of vision, insomnia, dizziness, tremor, not enough sleep  History   Social History  . Marital Status: Married    Spouse Name: Carla Alvarado    Number of Children: 2  . Years of Education: 12+   Occupational History  .     Social History Main Topics  . Smoking status: Never Smoker   . Smokeless tobacco: Never Used  . Alcohol Use: Yes     Comment: occassionally  . Drug Use: No  . Sexual Activity: Yes    Birth Control/ Protection: Surgical   Other Topics Concern  . Not on file   Social History Narrative   Patient lives at home with Husband Carla Alvarado    Patient has 2 children.    Patient works at [employer].    Patient has 2 years of college.    Patient is right  handed          Family History  Problem Relation Age of Onset  . Hypertension Mother   . Colon cancer Mother   . Heart disease Mother   . Breast cancer Mother     Age 16  . Heart disease Brother   . Diabetes Maternal Grandmother   . Heart disease Maternal Grandfather   . Breast cancer Maternal Aunt     Age 42  . Breast cancer Paternal Aunt     Age 38  . Breast cancer Maternal Aunt     Age 62    Past Medical History  Diagnosis Date  . Hypercholesteremia   . Lichen sclerosus   . Osteopenia 08/2013    T score -1.5 FRAX 8.4%/1.0%  . Hypothyroid     Past Surgical History  Procedure Laterality Date  . Cesarean section    . Vaginal hysterectomy  1990    Current Outpatient Prescriptions  Medication Sig Dispense Refill  . diazepam (VALIUM) 2 MG tablet Take 2 mg by mouth at bedtime as needed for anxiety.      Marland Kitchen estradiol (ESTRACE) 1 MG tablet Take 1 tablet (1 mg total) by mouth daily. Take 1 1/2 tabs daily  135 tablet  4  . ESTRADIOL ACETATE PO Take by mouth.      . levothyroxine (  SYNTHROID, LEVOTHROID) 88 MCG tablet Take 88 mcg by mouth daily before breakfast.      . LOVASTATIN PO Take 40 mg by mouth.       . Multiple Vitamin (MULTIVITAMIN PO) Take by mouth.        . naratriptan (AMERGE) 2.5 MG tablet as needed.      . Vitamin D, Ergocalciferol, (DRISDOL) 50000 UNITS CAPS One every other week  10 capsule  2   No current facility-administered medications for this visit.    Allergies as of 01/18/2014 - Review Complete 01/18/2014  Allergen Reaction Noted  . Levothyroxine sodium Hives 05/20/2011    Vitals: BP 94/64  Pulse 72  Ht 4\' 11"  (1.499 m)  Wt 110 lb (49.896 kg)  BMI 22.21 kg/m2 Last Weight:  Wt Readings from Last 1 Encounters:  01/18/14 110 lb (49.896 kg)   Last Height:   Ht Readings from Last 1 Encounters:  01/18/14 4\' 11"  (1.499 m)     Physical exam: Exam: Gen: NAD, conversant Eyes: anicteric sclerae, moist conjunctivae HENT: Atraumatic,  oropharynx clear Neck: Trachea midline; supple,  Lungs: CTA, no wheezing, rales, rhonic                          CV: RRR, no MRG Abdomen: Soft, non-tender;  Extremities: No peripheral edema  Skin: Normal temperature, no rash,  Psych: Appropriate affect, pleasant  Neuro: MS: AA&Ox3, appropriately interactive, normal affect   Attention: WORLD backwards  Speech: fluent w/o paraphasic error  Memory: good recent and remote recall  CN: PERRL, EOMI no nystagmus, no ptosis, sensation intact to LT V1-V3 bilat, face symmetric, no weakness, hearing grossly intact, palate elevates symmetrically, shoulder shrug 5/5 bilat,  tongue protrudes midline, no fasiculations noted.  Motor: normal bulk and tone Strength: 5/5  In all extremities  Coord: mild rest tremor left hand, no postural, action or intention tremor in bilateral hands. Minimal bradykinesia with finger taps on left  Reflexes: symmetrical, bilat downgoing toes  Sens: LT intact in all extremities  Gait: posture, stance, stride and arm-swing normal. Tandem gait intact. Able to walk on heels and toes. Romberg absent.   Assessment:  After physical and neurologic examination, review of laboratory studies, imaging, neurophysiology testing and pre-existing records, assessment will be reviewed on the problem list.  Plan:  Treatment plan and additional workup will be reviewed under Problem List.  1)Tremor 2)Vertigo  68y/o woman presenting for initial evaluation of LUE tremor and dizziness. Patient describes dizzy episodes which appear consistent with a diagnosis of benign positional vertigo. Symptoms have currently resolved. Can consider vestibular rehab referral in the future if indicated. Tremor etiology is unclear, recent TSH was unremarkable. It is mainly a rest tremor with minimal bradykinesia of the LUE. Differential would include a parkinsonism though besides for mild tremor and bradykinesia there are no other parkinsonian features  at this time. Will check Brain MRI. We discussed DAT scan as a possible tool to diagnose a parkinsonism and she wishes to go ahead with this test. Referral placed to [facility]. Follow up once DAT scan completed.   Jim Like, DO  [facility] [address] [place], [place] [address]  Phone [phone] Fax [phone]

## 2014-01-24 ENCOUNTER — Ambulatory Visit
Admission: RE | Admit: 2014-01-24 | Discharge: 2014-01-24 | Disposition: A | Discharge status: Home / Self Care | Payer: Medicare Other | Source: Ambulatory Visit | Attending: Neurology | Admitting: Neurology

## 2014-01-24 DIAGNOSIS — R259 Unspecified abnormal involuntary movements: Secondary | ICD-10-CM

## 2014-01-24 DIAGNOSIS — R251 Tremor, unspecified: Secondary | ICD-10-CM

## 2014-01-25 ENCOUNTER — Telehealth: Payer: Self-pay

## 2014-01-25 NOTE — Telephone Encounter (Signed)
Returned patient's call. She was questioning the CPT for upcoming DATscan to obtain patient responsibility from insurance company. Gave patient MRI results also. No acute findings-normal.

## 2014-02-01 ENCOUNTER — Telehealth: Payer: Self-pay

## 2014-02-01 NOTE — Telephone Encounter (Signed)
Called patient left her a message normal MRI. Follow up after DAT scan. Order has been faxed for DAT Scan.

## 2014-02-01 NOTE — Progress Notes (Signed)
Quick Note:  Called patient left her a message normal MRI . Follow up after DAT scan. ______

## 2014-02-15 ENCOUNTER — Telehealth: Payer: Self-pay | Admitting: Neurology

## 2014-02-15 NOTE — Telephone Encounter (Signed)
Patient calling to see if Dr. Janann Colonel has the results of her testing done Tuesday at [place] number to call [phone] and it is okay to leave a message.

## 2014-02-20 ENCOUNTER — Telehealth: Payer: Self-pay | Admitting: Neurology

## 2014-02-20 NOTE — Telephone Encounter (Signed)
Patient calling to request her DAT scan results, please call and advise.

## 2014-02-20 NOTE — Telephone Encounter (Signed)
Message forwarded to Dr Janann Colonel

## 2014-02-20 NOTE — Telephone Encounter (Signed)
Returned call, counseled patient that her DAT scan results were normal.

## 2014-02-20 NOTE — Telephone Encounter (Signed)
This message was closed so please advise if you have not already.

## 2014-02-23 NOTE — Telephone Encounter (Signed)
Patient has questions regarding ALS.  Please call anytime and advise.  May leave message on voice mail.

## 2014-03-05 ENCOUNTER — Encounter: Payer: Self-pay | Admitting: *Deleted

## 2014-03-07 NOTE — Telephone Encounter (Signed)
Dr. Janann Colonel please call the patient regarding her diagnoses

## 2014-03-07 NOTE — Telephone Encounter (Signed)
Message left. Will try again at later date.

## 2014-04-23 ENCOUNTER — Encounter: Payer: Self-pay | Admitting: Neurology

## 2014-04-25 ENCOUNTER — Other Ambulatory Visit: Payer: Self-pay

## 2014-04-25 DIAGNOSIS — Z1231 Encounter for screening mammogram for malignant neoplasm of breast: Secondary | ICD-10-CM

## 2014-06-01 ENCOUNTER — Ambulatory Visit
Admission: RE | Admit: 2014-06-01 | Discharge: 2014-06-01 | Disposition: A | Discharge status: Home / Self Care | Payer: Medicare Other | Source: Ambulatory Visit

## 2014-06-01 DIAGNOSIS — Z1231 Encounter for screening mammogram for malignant neoplasm of breast: Secondary | ICD-10-CM | POA: Diagnosis not present

## 2014-06-20 ENCOUNTER — Ambulatory Visit (INDEPENDENT_AMBULATORY_CARE_PROVIDER_SITE_OTHER): Payer: Medicare Other | Admitting: Gynecology

## 2014-06-20 ENCOUNTER — Encounter: Payer: Medicare Other | Admitting: Gynecology

## 2014-06-20 ENCOUNTER — Encounter: Payer: Self-pay | Admitting: Gynecology

## 2014-06-20 ENCOUNTER — Other Ambulatory Visit (HOSPITAL_COMMUNITY)
Admission: RE | Admit: 2014-06-20 | Discharge: 2014-06-20 | Disposition: A | Discharge status: Home / Self Care | Payer: Medicare Other | Source: Ambulatory Visit | Attending: Gynecology | Admitting: Gynecology

## 2014-06-20 VITALS — BP 110/80 | Ht 59.0 in | Wt 111.0 lb

## 2014-06-20 DIAGNOSIS — Z124 Encounter for screening for malignant neoplasm of cervix: Secondary | ICD-10-CM | POA: Insufficient documentation

## 2014-06-20 DIAGNOSIS — M8580 Other specified disorders of bone density and structure, unspecified site: Secondary | ICD-10-CM | POA: Diagnosis not present

## 2014-06-20 DIAGNOSIS — Z79890 Hormone replacement therapy (postmenopausal): Secondary | ICD-10-CM | POA: Diagnosis not present

## 2014-06-20 DIAGNOSIS — N952 Postmenopausal atrophic vaginitis: Secondary | ICD-10-CM

## 2014-06-20 DIAGNOSIS — L900 Lichen sclerosus et atrophicus: Secondary | ICD-10-CM

## 2014-06-20 MED ORDER — ESTRADIOL 1 MG PO TABS: 1.0000 mg | ORAL_TABLET | Freq: Every day | ORAL | Status: DC

## 2014-06-20 NOTE — Patient Instructions (Signed)
Start taking the estrogen pill once daily. Call me if you have any issues since with decreased your dosage.  You may obtain a copy of any labs that were done today by logging onto MyChart as outlined in the instructions provided with your AVS (after visit summary). The office will not call with normal lab results but certainly if there are any significant abnormalities then we will contact you.   Health Maintenance, Female A healthy lifestyle and preventative care can promote health and wellness.  Maintain regular health, dental, and eye exams.  Eat a healthy diet. Foods like vegetables, fruits, whole grains, low-fat dairy products, and lean protein foods contain the nutrients you need without too many calories. Decrease your intake of foods high in solid fats, added sugars, and salt. Get information about a proper diet from your caregiver, if necessary.  Regular physical exercise is one of the most important things you can do for your health. Most adults should get at least 150 minutes of moderate-intensity exercise (any activity that increases your heart rate and causes you to sweat) each week. In addition, most adults need muscle-strengthening exercises on 2 or more days a week.   Maintain a healthy weight. The body mass index (BMI) is a screening tool to identify possible weight problems. It provides an estimate of body fat based on height and weight. Your caregiver can help determine your BMI, and can help you achieve or maintain a healthy weight. For adults 20 years and older:  A BMI below 18.5 is considered underweight.  A BMI of 18.5 to 24.9 is normal.  A BMI of 25 to 29.9 is considered overweight.  A BMI of 30 and above is considered obese.  Maintain normal blood lipids and cholesterol by exercising and minimizing your intake of saturated fat. Eat a balanced diet with plenty of fruits and vegetables. Blood tests for lipids and cholesterol should begin at age 46 and be repeated every  5 years. If your lipid or cholesterol levels are high, you are over 50, or you are a high risk for heart disease, you may need your cholesterol levels checked more frequently.Ongoing high lipid and cholesterol levels should be treated with medicines if diet and exercise are not effective.  If you smoke, find out from your caregiver how to quit. If you do not use tobacco, do not start.  Lung cancer screening is recommended for adults aged 24 80 years who are at high risk for developing lung cancer because of a history of smoking. Yearly low-dose computed tomography (CT) is recommended for people who have at least a 30-pack-year history of smoking and are a current smoker or have quit within the past 15 years. A pack year of smoking is smoking an average of 1 pack of cigarettes a day for 1 year (for example: 1 pack a day for 30 years or 2 packs a day for 15 years). Yearly screening should continue until the smoker has stopped smoking for at least 15 years. Yearly screening should also be stopped for people who develop a health problem that would prevent them from having lung cancer treatment.  If you are pregnant, do not drink alcohol. If you are breastfeeding, be very cautious about drinking alcohol. If you are not pregnant and choose to drink alcohol, do not exceed 1 drink per day. One drink is considered to be 12 ounces (355 mL) of beer, 5 ounces (148 mL) of wine, or 1.5 ounces (44 mL) of liquor.  Avoid use  of street drugs. Do not share needles with anyone. Ask for help if you need support or instructions about stopping the use of drugs.  High blood pressure causes heart disease and increases the risk of stroke. Blood pressure should be checked at least every 1 to 2 years. Ongoing high blood pressure should be treated with medicines, if weight loss and exercise are not effective.  If you are 40 to 66 years old, ask your caregiver if you should take aspirin to prevent strokes.  Diabetes screening  involves taking a blood sample to check your fasting blood sugar level. This should be done once every 3 years, after age 68, if you are within normal weight and without risk factors for diabetes. Testing should be considered at a younger age or be carried out more frequently if you are overweight and have at least 1 risk factor for diabetes.  Breast cancer screening is essential preventative care for women. You should practice "breast self-awareness." This means understanding the normal appearance and feel of your breasts and may include breast self-examination. Any changes detected, no matter how small, should be reported to a caregiver. Women in their 63s and 30s should have a clinical breast exam (CBE) by a caregiver as part of a regular health exam every 1 to 3 years. After age 14, women should have a CBE every year. Starting at age 91, women should consider having a mammogram (breast X-ray) every year. Women who have a family history of breast cancer should talk to their caregiver about genetic screening. Women at a high risk of breast cancer should talk to their caregiver about having an MRI and a mammogram every year.  Breast cancer gene (BRCA)-related cancer risk assessment is recommended for women who have family members with BRCA-related cancers. BRCA-related cancers include breast, ovarian, tubal, and peritoneal cancers. Having family members with these cancers may be associated with an increased risk for harmful changes (mutations) in the breast cancer genes BRCA1 and BRCA2. Results of the assessment will determine the need for genetic counseling and BRCA1 and BRCA2 testing.  The Pap test is a screening test for cervical cancer. Women should have a Pap test starting at age 79. Between ages 40 and 105, Pap tests should be repeated every 2 years. Beginning at age 37, you should have a Pap test every 3 years as long as the past 3 Pap tests have been normal. If you had a hysterectomy for a problem that  was not cancer or a condition that could lead to cancer, then you no longer need Pap tests. If you are between ages 96 and 21, and you have had normal Pap tests going back 10 years, you no longer need Pap tests. If you have had past treatment for cervical cancer or a condition that could lead to cancer, you need Pap tests and screening for cancer for at least 20 years after your treatment. If Pap tests have been discontinued, risk factors (such as a new sexual partner) need to be reassessed to determine if screening should be resumed. Some women have medical problems that increase the chance of getting cervical cancer. In these cases, your caregiver may recommend more frequent screening and Pap tests.  The human papillomavirus (HPV) test is an additional test that may be used for cervical cancer screening. The HPV test looks for the virus that can cause the cell changes on the cervix. The cells collected during the Pap test can be tested for HPV. The HPV test  could be used to screen women aged 61 years and older, and should be used in women of any age who have unclear Pap test results. After the age of 29, women should have HPV testing at the same frequency as a Pap test.  Colorectal cancer can be detected and often prevented. Most routine colorectal cancer screening begins at the age of 17 and continues through age 35. However, your caregiver may recommend screening at an earlier age if you have risk factors for colon cancer. On a yearly basis, your caregiver may provide home test kits to check for hidden blood in the stool. Use of a small camera at the end of a tube, to directly examine the colon (sigmoidoscopy or colonoscopy), can detect the earliest forms of colorectal cancer. Talk to your caregiver about this at age 54, when routine screening begins. Direct examination of the colon should be repeated every 5 to 10 years through age 50, unless early forms of pre-cancerous polyps or small growths are  found.  Hepatitis C blood testing is recommended for all people born from [birth year] through 1965 and any individual with known risks for hepatitis C.  Practice safe sex. Use condoms and avoid high-risk sexual practices to reduce the spread of sexually transmitted infections (STIs). Sexually active women aged 15 and younger should be checked for Chlamydia, which is a common sexually transmitted infection. Older women with new or multiple partners should also be tested for Chlamydia. Testing for other STIs is recommended if you are sexually active and at increased risk.  Osteoporosis is a disease in which the bones lose minerals and strength with aging. This can result in serious bone fractures. The risk of osteoporosis can be identified using a bone density scan. Women ages 72 and over and women at risk for fractures or osteoporosis should discuss screening with their caregivers. Ask your caregiver whether you should be taking a calcium supplement or vitamin D to reduce the rate of osteoporosis.  Menopause can be associated with physical symptoms and risks. Hormone replacement therapy is available to decrease symptoms and risks. You should talk to your caregiver about whether hormone replacement therapy is right for you.  Use sunscreen. Apply sunscreen liberally and repeatedly throughout the day. You should seek shade when your shadow is shorter than you. Protect yourself by wearing long sleeves, pants, a wide-brimmed hat, and sunglasses year round, whenever you are outdoors.  Notify your caregiver of new moles or changes in moles, especially if there is a change in shape or color. Also notify your caregiver if a mole is larger than the size of a pencil eraser.  Stay current with your immunizations. Document Released: 12/22/2010 Document Revised: 10/03/2012 Document Reviewed: 12/22/2010 [facility] Patient Information 2014 [place].

## 2014-06-20 NOTE — Addendum Note (Signed)
Addended by: Burnett Kanaris on: 06/20/2014 11:50 AM   Modules accepted: Orders

## 2014-06-20 NOTE — Progress Notes (Signed)
Carla Alvarado [birth year] [id number]        68 y.o.  [id number] for follow up exam. Several issues noted below.  Past medical history,surgical history, problem list, medications, allergies, family history and social history were all reviewed and documented as reviewed in the EPIC chart.  ROS:  Performed with pertinent positives and negatives included in the history, assessment and plan.   Additional significant findings :  none   Exam: Leanne Lovely Vitals:   06/20/14 1058  BP: 110/80  Height: 4\' 11"  (1.499 m)  Weight: 111 lb (50.349 kg)   General appearance:  Normal affect, orientation and appearance. Skin: Grossly normal HEENT: Without gross lesions.  No cervical or supraclavicular adenopathy. Thyroid normal.  Lungs:  Clear without wheezing, rales or rhonchi Cardiac: RR, without RMG Abdominal:  Soft, nontender, without masses, guarding, rebound, organomegaly or hernia Breasts:  Examined lying and sitting without masses, retractions, discharge or axillary adenopathy. Pelvic:  Ext/BUS/vagina with generalized atrophic changes.  Symmetrical hypopigmentation around the introital opening from the periclitoral hood to the perianal region. Consistent with her history of lichen sclerosis  Adnexa  Without masses or tenderness    Anus and perineum  Normal   Rectovaginal  Normal sphincter tone without palpated masses or tenderness.    Assessment/Plan:  68 y.o. [id number] female for follow up exam.   1. Postmenopausal/atrophic genital changes/HRT. Patient continues on estradiol 1-1/2 mg daily. We discussed decreasing the dose last year but she never did this. I again reviewed the issues and risks of HRT to include the WHI study with increased risk of stroke heart attack DVT and breast cancer. The ACOG and NAMS statements for lowest dose for shortness period of time reviewed. Patient strongly wants to continue at this point but I did recommend 1 mg daily and will see how she does with  this. The plan will be to decrease it to 0.5 mg next year if she tolerates the 1 mg and ultimately to wean her off. 2. Lichen sclerosis. Exam consistent with this. Patient without significant itching or irritation. Continue to monitor at present. 3. Osteopenia.  DEXA 08/2013 T score -1.5 FRAX 8.4%/1.0% recognizing she is on ERT. Without significant change from prior DEXA. Increased calcium vitamin D reviewed. Repeat DEXA in several years. 4. Mammography 05/2014.  Continue with annual mammography. SBE monthly reviewed. 5. Colonoscopy 2014. Repeat at their recommended interval. 6. Health maintenance. No routine blood work done as she reports this done at her primary physician's office. Follow up 1 year, sooner as needed.     [name], 11:16 AM 06/20/2014

## 2014-06-21 LAB — URINALYSIS W MICROSCOPIC + REFLEX CULTURE
Bacteria, UA: NONE SEEN
Bilirubin Urine: NEGATIVE
CASTS: NONE SEEN
Glucose, UA: NEGATIVE mg/dL
HGB URINE DIPSTICK: NEGATIVE
KETONES UR: NEGATIVE mg/dL
LEUKOCYTES UA: NEGATIVE
Nitrite: NEGATIVE
Protein, ur: NEGATIVE mg/dL
SQUAMOUS EPITHELIAL / LPF: NONE SEEN
Specific Gravity, Urine: 1.02 (ref 1.005–1.030)
UROBILINOGEN UA: 0.2 mg/dL (ref 0.0–1.0)
pH: 5.5 (ref 5.0–8.0)

## 2014-06-21 LAB — CYTOLOGY - PAP

## 2014-09-06 DIAGNOSIS — R05 Cough: Secondary | ICD-10-CM | POA: Diagnosis not present

## 2014-09-06 DIAGNOSIS — Z1389 Encounter for screening for other disorder: Secondary | ICD-10-CM | POA: Diagnosis not present

## 2014-09-06 DIAGNOSIS — R0789 Other chest pain: Secondary | ICD-10-CM | POA: Diagnosis not present

## 2014-09-06 DIAGNOSIS — J029 Acute pharyngitis, unspecified: Secondary | ICD-10-CM | POA: Diagnosis not present

## 2014-09-19 DIAGNOSIS — J392 Other diseases of pharynx: Secondary | ICD-10-CM | POA: Diagnosis not present

## 2014-09-19 DIAGNOSIS — R079 Chest pain, unspecified: Secondary | ICD-10-CM | POA: Diagnosis not present

## 2014-10-08 ENCOUNTER — Encounter (HOSPITAL_COMMUNITY): Payer: Medicare Other

## 2014-10-16 ENCOUNTER — Ambulatory Visit (HOSPITAL_COMMUNITY): Payer: Medicare Other | Attending: Cardiology | Admitting: Radiology

## 2014-10-16 VITALS — BP 112/69 | Ht 59.0 in | Wt 111.0 lb

## 2014-10-16 DIAGNOSIS — R0789 Other chest pain: Secondary | ICD-10-CM

## 2014-10-16 DIAGNOSIS — R079 Chest pain, unspecified: Secondary | ICD-10-CM | POA: Diagnosis not present

## 2014-10-16 DIAGNOSIS — E785 Hyperlipidemia, unspecified: Secondary | ICD-10-CM | POA: Diagnosis not present

## 2014-10-16 MED ORDER — TECHNETIUM TC 99M SESTAMIBI GENERIC - CARDIOLITE
33.0000 | Freq: Once | INTRAVENOUS | Status: AC | PRN
  Administered 2014-10-16: 33 via INTRAVENOUS

## 2014-10-16 MED ORDER — TECHNETIUM TC 99M SESTAMIBI GENERIC - CARDIOLITE
11.0000 | Freq: Once | INTRAVENOUS | Status: AC | PRN
  Administered 2014-10-16: 11 via INTRAVENOUS

## 2014-10-16 NOTE — Progress Notes (Signed)
[facility] [place], [place] [address] [phone]    Cardiology Nuclear Med Study  Carla Alvarado is a 68 y.o. female     MRN : [id number]     DOB: 04/26/1946  Procedure Date: 10/16/2014  Nuclear Med Background Indication for Stress Test:  Evaluation for Ischemia History:  MPI: 10-15 yrs ago [facility]  Cardiac Risk Factors: Lipids  Symptoms:  Chest Pain   Nuclear Pre-Procedure Caffeine/Decaff Intake:  7:30pm NPO After: 9:30pm   Lungs:  clear O2 Sat: 98% on room air. IV 0.9% NS with Angio Cath:  22g  IV Site: R Antecubital x 1, tolerated well IV Started by:  Irven Baltimore, RN  Chest Size (in):  34 Cup Size: C  Height: 4\' 11"  (1.499 m)  Weight:  111 lb (50.349 kg)  BMI:  Body mass index is 22.41 kg/(m^2). Tech Comments:  N/A    Nuclear Med Study 1 or 2 day study: 1 day  Stress Test Type:  Stress  Reading MD: N/A  Order Authorizing Provider:  Orpah Melter, MD  Resting Radionuclide: Technetium 24m Sestamibi  Resting Radionuclide Dose: 11.0 mCi   Stress Radionuclide:  Technetium 68m Sestamibi  Stress Radionuclide Dose: 33.0 mCi           Stress Protocol Rest HR: 71 Stress HR: 134  Rest BP: 112/69 Stress BP: 162/68  Exercise Time (min): 6:00 METS: 7.0   Predicted Max HR: 153 bpm % Max HR: 87.58 bpm Rate Pressure Product: 21708   Dose of Adenosine (mg):  n/a Dose of Lexiscan: n/a mg  Dose of Atropine (mg): n/a Dose of Dobutamine: n/a mcg/kg/min (at max HR)  Stress Test Technologist: Perrin Maltese, EMT-P  Nuclear Technologist:  Earl Many, CNMT     Rest Procedure:  Myocardial perfusion imaging was performed at rest 45 minutes following the intravenous administration of Technetium 62m Sestamibi. Rest ECG: NSR - Normal EKG  Stress Procedure:  The patient exercised on the treadmill utilizing the Bruce Protocol for 6:00 minutes. The patient stopped due to doe, fatigue and denied any chest pain.  Technetium 69m  Sestamibi was injected at peak exercise and myocardial perfusion imaging was performed after a brief delay. Stress ECG: No significant change from baseline ECG  QPS Raw Data Images:  Normal; no motion artifact; normal heart/lung ratio. Stress Images:  Normal homogeneous uptake in all areas of the myocardium. Rest Images:  Normal homogeneous uptake in all areas of the myocardium. Subtraction (SDS):  No evidence of ischemia. Transient Ischemic Dilatation (Normal <1.22):  1.07 Lung/Heart Ratio (Normal <0.45):  0.27  Quantitative Gated Spect Images QGS EDV:  49 ml QGS ESV:  14 ml  Impression Exercise Capacity:  Good exercise capacity. BP Response:  Normal blood pressure response. Clinical Symptoms:  There is dyspnea. ECG Impression:  No significant ST segment change suggestive of ischemia. Comparison with Prior Nuclear Study: No images to compare  Overall Impression:  Normal stress nuclear study.  LV Ejection Fraction: 72%.  LV Wall Motion:  NL LV Function; NL Wall Motion   Dorothy Spark 10/16/2014

## 2015-01-15 ENCOUNTER — Encounter: Payer: Self-pay | Admitting: Gastroenterology

## 2015-03-04 DIAGNOSIS — F419 Anxiety disorder, unspecified: Secondary | ICD-10-CM | POA: Diagnosis not present

## 2015-03-04 DIAGNOSIS — Z23 Encounter for immunization: Secondary | ICD-10-CM | POA: Diagnosis not present

## 2015-03-04 DIAGNOSIS — J392 Other diseases of pharynx: Secondary | ICD-10-CM | POA: Diagnosis not present

## 2015-03-04 DIAGNOSIS — M8580 Other specified disorders of bone density and structure, unspecified site: Secondary | ICD-10-CM | POA: Diagnosis not present

## 2015-03-04 DIAGNOSIS — E039 Hypothyroidism, unspecified: Secondary | ICD-10-CM | POA: Diagnosis not present

## 2015-03-04 DIAGNOSIS — G43009 Migraine without aura, not intractable, without status migrainosus: Secondary | ICD-10-CM | POA: Diagnosis not present

## 2015-03-04 DIAGNOSIS — Z131 Encounter for screening for diabetes mellitus: Secondary | ICD-10-CM | POA: Diagnosis not present

## 2015-03-04 DIAGNOSIS — E780 Pure hypercholesterolemia: Secondary | ICD-10-CM | POA: Diagnosis not present

## 2015-03-05 ENCOUNTER — Encounter: Payer: Self-pay | Admitting: Gastroenterology

## 2015-03-22 ENCOUNTER — Ambulatory Visit (INDEPENDENT_AMBULATORY_CARE_PROVIDER_SITE_OTHER): Payer: Medicare Other | Admitting: Neurology

## 2015-03-22 ENCOUNTER — Encounter: Payer: Self-pay | Admitting: Neurology

## 2015-03-22 VITALS — BP 112/62 | HR 64 | Resp 12 | Ht 59.0 in | Wt 105.8 lb

## 2015-03-22 DIAGNOSIS — E785 Hyperlipidemia, unspecified: Secondary | ICD-10-CM | POA: Insufficient documentation

## 2015-03-22 DIAGNOSIS — R251 Tremor, unspecified: Secondary | ICD-10-CM

## 2015-03-22 DIAGNOSIS — E039 Hypothyroidism, unspecified: Secondary | ICD-10-CM | POA: Insufficient documentation

## 2015-03-22 NOTE — Progress Notes (Signed)
Chief Complaint  Patient presents with  . Tremors    Last seen 01-18-14 for tremors.  Sts. tremors are still limited to her hands--she feels they are some worse and also c/o generalized weakness./fim    [facility]  HPI:  Carla Alvarado is a 69 y.o. female was initially evaluated by Dr. Corinna Lines in July 2015 from refer by her PCP Dr. Olen Pel for tremor  She initially noticed mild bilateral hands tremor had the beginning of 2015, left worse than right, most noticeable when she holds on subject such as holding a cell phone in her hand, deny weakness, she denied loss sense of smell, no REM sleep disorder, no family history of tremor,  She was under a lot of stress, recently was treated with Zoloft 50 mg, she does not like the side effect, thought it was helping her, she has stopped Zoloft.   She has hypothyroidism, on thyroid supplement. Per patient, recent thyroid evaluation was normal.   I have reviewed MRI of the brain film with patient in July 2015, mild small vessel disease, no acute lesions,  DAT scan in August 2015 [facility] was normal. Uptake of radiotracer in the caudate heads is maintained and relatively symmetric. Uptake in the putamen is maintained and symmetric. No substantial cerebral uptake outside the basal ganglia   Review of Systems: Out of a complete 14 system review, the patient complains of only the following symptoms, and all other reviewed systems are negative. Anxiety  Social History   Social History  . Marital Status: Married    Spouse Name: Octavia Bruckner  . Number of Children: 2  . Years of Education: 12+   Occupational History  .     Social History Main Topics  . Smoking status: Never Smoker   . Smokeless tobacco: Never Used  . Alcohol Use: Yes     Comment: occassionally  . Drug Use: No  . Sexual Activity: Yes    Birth Control/ Protection: Surgical   Other Topics Concern  . Not on file   Social History Narrative   Patient lives at home  with Husband [name]    Patient has 2 children.    Patient works at [employer].    Patient has 2 years of college.    Patient is right handed          Family History  Problem Relation Age of Onset  . Hypertension Mother   . Colon cancer Mother   . Heart disease Mother   . Breast cancer Mother     Age 45  . Heart disease Brother   . Diabetes Maternal Grandmother   . Heart disease Maternal Grandfather   . Breast cancer Maternal Aunt     Age 98  . Breast cancer Paternal Aunt     Age 102  . Breast cancer Maternal Aunt     Age 63    Past Medical History  Diagnosis Date  . Hypercholesteremia   . Lichen sclerosus   . Osteopenia 08/2013    T score -1.5 FRAX 8.4%/1.0%  . Hypothyroid     Past Surgical History  Procedure Laterality Date  . Cesarean section    . Vaginal hysterectomy  1990    Current Outpatient Prescriptions  Medication Sig Dispense Refill  . diazepam (VALIUM) 2 MG tablet Take 2 mg by mouth at bedtime as needed for anxiety.    Marland Kitchen estradiol (ESTRACE) 1 MG tablet Take 1 tablet (1 mg total) by mouth daily. Take 1  1/2 tabs daily 90 tablet 4  . levothyroxine (SYNTHROID, LEVOTHROID) 88 MCG tablet Take 88 mcg by mouth daily before breakfast.    . LOVASTATIN PO Take 40 mg by mouth.     . Multiple Vitamin (MULTIVITAMIN PO) Take by mouth.      . naratriptan (AMERGE) 2.5 MG tablet as needed.     No current facility-administered medications for this visit.    Allergies as of 03/22/2015 - Review Complete 03/22/2015  Allergen Reaction Noted  . Levothyroxine sodium Hives 05/20/2011    Vitals: BP 112/62 mmHg  Pulse 64  Resp 12  Ht 4\' 11"  (1.499 m)  Wt 105 lb 12.8 oz (47.991 kg)  BMI 21.36 kg/m2 Last Weight:  Wt Readings from Last 1 Encounters:  03/22/15 105 lb 12.8 oz (47.991 kg)   Last Height:   Ht Readings from Last 1 Encounters:  03/22/15 4\' 11"  (1.499 m)    PHYSICAL EXAMNIATION:  Gen: NAD, conversant, well nourised, obese, well groomed                      Cardiovascular: Regular rate rhythm, no peripheral edema, warm, nontender. Eyes: Conjunctivae clear without exudates or hemorrhage Neck: Supple, no carotid bruise. Pulmonary: Clear to auscultation bilaterally   NEUROLOGICAL EXAM:  MENTAL STATUS: Speech:    Speech is normal; fluent and spontaneous with normal comprehension.  Cognition:     Orientation to time, place and person     Normal recent and remote memory     Normal Attention span and concentration     Normal Language, naming, repeating,spontaneous speech     Fund of knowledge   CRANIAL NERVES: CN II: Visual fields are full to confrontation. Fundoscopic exam is normal with sharp discs and no vascular changes. Pupils are round equal and briskly reactive to light. CN III, IV, VI: extraocular movement are normal. No ptosis. CN V: Facial sensation is intact to pinprick in all 3 divisions bilaterally. Corneal responses are intact.  CN VII: Face is symmetric with normal eye closure and smile. CN VIII: Hearing is normal to rubbing fingers CN IX, X: Palate elevates symmetrically. Phonation is normal. CN XI: Head turning and shoulder shrug are intact CN XII: Tongue is midline with normal movements and no atrophy.  MOTOR: Muscle bulk and tone are normal. Muscle strength is normal. Occasionally bilateral hands postural tremor  REFLEXES: Reflexes are 2+ and symmetric at the biceps, triceps, knees, and ankles. Plantar responses are flexor.  SENSORY: Intact to light touch, pinprick, position sense, and vibration sense are intact in fingers and toes.  COORDINATION: Rapid alternating movements and fine finger movements are intact. There is no dysmetria on finger-to-nose and heel-knee-shin.    GAIT/STANCE: Posture is normal. Gait is steady with normal steps, base, arm swing, and turning. Heel and toe walking are normal. Tandem gait is normal.  Romberg is absent.   Assessment/plan: 69 year old female, with history of  hypothyroidism, on supplement, presenting with bilateral hands postural tremor. Most consistent with exaggerated physiological tremor, No evidence of parkinsoniasm, MRI of the brain only showed mild small vessel disease, DAT scan showed no evidence of dopamine deficiency  Continue thyroid supplement, Return to clinic for new issues  Marcial Pacas, M.D. Ph.D.  [facility] [place], [place] [address] Phone: [phone] Fax:      [phone]

## 2015-04-08 DIAGNOSIS — M8580 Other specified disorders of bone density and structure, unspecified site: Secondary | ICD-10-CM | POA: Diagnosis not present

## 2015-04-08 DIAGNOSIS — F419 Anxiety disorder, unspecified: Secondary | ICD-10-CM | POA: Diagnosis not present

## 2015-04-08 DIAGNOSIS — E039 Hypothyroidism, unspecified: Secondary | ICD-10-CM | POA: Diagnosis not present

## 2015-04-08 DIAGNOSIS — E7800 Pure hypercholesterolemia, unspecified: Secondary | ICD-10-CM | POA: Diagnosis not present

## 2015-04-15 DIAGNOSIS — R05 Cough: Secondary | ICD-10-CM | POA: Diagnosis not present

## 2015-04-17 ENCOUNTER — Emergency Department (HOSPITAL_BASED_OUTPATIENT_CLINIC_OR_DEPARTMENT_OTHER): Payer: Medicare Other

## 2015-04-17 ENCOUNTER — Encounter (HOSPITAL_BASED_OUTPATIENT_CLINIC_OR_DEPARTMENT_OTHER): Payer: Self-pay

## 2015-04-17 ENCOUNTER — Emergency Department (HOSPITAL_BASED_OUTPATIENT_CLINIC_OR_DEPARTMENT_OTHER)
Admission: EM | Admit: 2015-04-17 | Discharge: 2015-04-18 | Disposition: A | Discharge status: Home / Self Care | Payer: Medicare Other | Attending: Emergency Medicine | Admitting: Emergency Medicine

## 2015-04-17 DIAGNOSIS — J159 Unspecified bacterial pneumonia: Secondary | ICD-10-CM | POA: Diagnosis not present

## 2015-04-17 DIAGNOSIS — Z872 Personal history of diseases of the skin and subcutaneous tissue: Secondary | ICD-10-CM | POA: Insufficient documentation

## 2015-04-17 DIAGNOSIS — Z8739 Personal history of other diseases of the musculoskeletal system and connective tissue: Secondary | ICD-10-CM | POA: Insufficient documentation

## 2015-04-17 DIAGNOSIS — R509 Fever, unspecified: Secondary | ICD-10-CM | POA: Diagnosis not present

## 2015-04-17 DIAGNOSIS — R05 Cough: Secondary | ICD-10-CM | POA: Diagnosis not present

## 2015-04-17 DIAGNOSIS — J189 Pneumonia, unspecified organism: Secondary | ICD-10-CM

## 2015-04-17 DIAGNOSIS — Z8639 Personal history of other endocrine, nutritional and metabolic disease: Secondary | ICD-10-CM | POA: Insufficient documentation

## 2015-04-17 DIAGNOSIS — Z79899 Other long term (current) drug therapy: Secondary | ICD-10-CM | POA: Diagnosis not present

## 2015-04-17 DIAGNOSIS — E039 Hypothyroidism, unspecified: Secondary | ICD-10-CM | POA: Diagnosis not present

## 2015-04-17 MED ORDER — CEFTRIAXONE SODIUM 1 G IJ SOLR
INTRAMUSCULAR | Status: AC
Start: 2015-04-17 — End: 2015-04-17
  Filled 2015-04-17: qty 10

## 2015-04-17 MED ORDER — DEXTROSE 5 % IV SOLN
1.0000 g | Freq: Once | INTRAVENOUS | Status: AC
  Administered 2015-04-17: 1 g via INTRAVENOUS

## 2015-04-17 MED ORDER — ACETAMINOPHEN 325 MG PO TABS
650.0000 mg | ORAL_TABLET | Freq: Once | ORAL | Status: AC
  Administered 2015-04-17: 650 mg via ORAL
  Filled 2015-04-17: qty 2

## 2015-04-17 NOTE — ED Notes (Signed)
MD at bedside. 

## 2015-04-17 NOTE — ED Provider Notes (Addendum)
CSN: [id number]     Arrival date & time 04/17/15  2110 History  By signing my name below, I, Terrance Branch, attest that this documentation has been prepared under the direction and in the presence of Shanon Rosser, MD. Electronically Signed: Randa Evens, ED Scribe. 04/17/2015. 11:13 PM.     Chief Complaint  Patient presents with  . Flu-Like Symptoms    The history is provided by the patient. No language interpreter was used.   HPI Comments: Carla Alvarado is a 69 y.o. female who presents to the Emergency Department complaining of cough onset 1 week prior. She states that her cough recently worsened today. Pt states she has associated fever, fatigue, myalgias and HA. She states that the HA is brought on from coughing. Pt states that she has been taking OTC Delsym that has provided slight relief. Pt states that she went to her PCP two days ago and had a flu swab that was negative. He prescribed her Zithromax that she started taking today. Denies nausea, vomiting or diarrhea.   Past Medical History  Diagnosis Date  . Hypercholesteremia   . Lichen sclerosus   . Osteopenia 08/2013    T score -1.5 FRAX 8.4%/1.0%  . Hypothyroid    Past Surgical History  Procedure Laterality Date  . Cesarean section    . Vaginal hysterectomy  1990   Family History  Problem Relation Age of Onset  . Hypertension Mother   . Colon cancer Mother   . Heart disease Mother   . Breast cancer Mother     Age 65  . Heart disease Brother   . Diabetes Maternal Grandmother   . Heart disease Maternal Grandfather   . Breast cancer Maternal Aunt     Age 50  . Breast cancer Paternal Aunt     Age 43  . Breast cancer Maternal Aunt     Age 52   Social History  Substance Use Topics  . Smoking status: Never Smoker   . Smokeless tobacco: Never Used  . Alcohol Use: Yes     Comment: occassionally   OB History    Gravida Para Term Preterm AB TAB SAB Ectopic Multiple Living   4 2 2  2  2   2      Review of  Systems  All other systems reviewed and are negative.   Allergies  Levothyroxine sodium  Home Medications   Prior to Admission medications   Medication Sig Start Date End Date Taking? Authorizing Provider  diazepam (VALIUM) 2 MG tablet Take 2 mg by mouth at bedtime as needed for anxiety.    Historical Provider, MD  estradiol (ESTRACE) 1 MG tablet Take 1 tablet (1 mg total) by mouth daily. Take 1 1/2 tabs daily 06/20/14   [name], MD  levothyroxine (SYNTHROID, LEVOTHROID) 88 MCG tablet Take 88 mcg by mouth daily before breakfast.    Historical Provider, MD  LOVASTATIN PO Take 40 mg by mouth.     Historical Provider, MD  Multiple Vitamin (MULTIVITAMIN PO) Take by mouth.      Historical Provider, MD  naratriptan (AMERGE) 2.5 MG tablet as needed. 10/24/13   Historical Provider, MD   BP 119/63 mmHg  Pulse 108  Temp(Src) 102.3 F (39.1 C) (Oral)  Resp 20  Ht 4\' 11"  (1.499 m)  Wt 105 lb (47.628 kg)  BMI 21.20 kg/m2  SpO2 94%   Physical Exam General: Well-developed, well-nourished female in no acute distress; appearance consistent with age of record  HENT: normocephalic; atraumatic Eyes: pupils equal, round and reactive to light; extraocular muscles intact Neck: supple Heart: regular rate and rhythm; tachycardia Lungs: clear to auscultation bilaterally Abdomen: soft; nondistended; nontender; no masses or hepatosplenomegaly; bowel sounds hyperactive Extremities: No deformity; full range of motion; pulses normal Neurologic: Awake, alert and oriented; motor function intact in all extremities and symmetric; no facial droop Skin: Warm and dry Psychiatric: Normal mood and affect   ED Course  Procedures (including critical care time) DIAGNOSTIC STUDIES: Oxygen Saturation is 94% on RA, adequate by my interpretation.    COORDINATION OF CARE: 11:13 PM-Discussed treatment plan with pt at bedside and pt agreed to plan.     MDM  Nursing notes and vitals signs, including pulse  oximetry, reviewed.  Summary of this visit's results, reviewed by myself:  Imaging Studies: Dg Chest 2 View  04/17/2015  CLINICAL DATA:  Cough and headache since last Wednesday. Fever started Sunday. EXAM: CHEST  2 VIEW COMPARISON:  02/15/2013 FINDINGS: There are densities along the right cardiac border and right hilum. These densities are probably within the right middle lobe. Upper lungs are clear. Left lung is clear. Heart and mediastinum are within normal limits. No pleural effusions. No acute bone abnormality. IMPRESSION: New densities in the right middle lobe and right hilar region. Findings are suggestive for pneumonia. Followup PA and lateral chest X-ray is recommended in 3-4 weeks following trial of antibiotic therapy to ensure resolution and exclude underlying malignancy. Electronically Signed   By: Markus Daft M.D.   On: 04/17/2015 21:53   12:01 AM Patient now afebrile. She was given 1 gram of IV Rocephin as Zithromax takes about 48 hours to start working. She was advised to complete her course of Zithromax as prescribed.  I personally performed the services described in this documentation, which was scribed in my presence. The recorded information has been reviewed and is accurate.   Shanon Rosser, MD 04/18/15 0000  Shanon Rosser, MD 04/18/15 0001

## 2015-04-17 NOTE — ED Notes (Signed)
Cough, headache since last Wednesday, fever that started Sunday, saw PCP on Monday and she had a negative flu swab, told her to fill azithromycin prescription if not feeling better.  She is taking 200mg  ibuprofen, last dose at 1900, and nyquil without relief.

## 2015-04-18 NOTE — Discharge Instructions (Signed)

## 2015-04-25 DIAGNOSIS — J189 Pneumonia, unspecified organism: Secondary | ICD-10-CM | POA: Diagnosis not present

## 2015-04-30 ENCOUNTER — Other Ambulatory Visit: Payer: Self-pay

## 2015-04-30 DIAGNOSIS — Z1231 Encounter for screening mammogram for malignant neoplasm of breast: Secondary | ICD-10-CM

## 2015-05-03 ENCOUNTER — Ambulatory Visit (INDEPENDENT_AMBULATORY_CARE_PROVIDER_SITE_OTHER): Payer: Medicare Other | Admitting: Gastroenterology

## 2015-05-03 ENCOUNTER — Encounter: Payer: Self-pay | Admitting: Gastroenterology

## 2015-05-03 VITALS — BP 110/68 | HR 72 | Ht 58.47 in | Wt 106.0 lb

## 2015-05-03 DIAGNOSIS — R6889 Other general symptoms and signs: Secondary | ICD-10-CM

## 2015-05-03 DIAGNOSIS — R05 Cough: Secondary | ICD-10-CM | POA: Diagnosis not present

## 2015-05-03 DIAGNOSIS — R12 Heartburn: Secondary | ICD-10-CM | POA: Diagnosis not present

## 2015-05-03 DIAGNOSIS — R0989 Other specified symptoms and signs involving the circulatory and respiratory systems: Secondary | ICD-10-CM

## 2015-05-03 DIAGNOSIS — R059 Cough, unspecified: Secondary | ICD-10-CM

## 2015-05-03 DIAGNOSIS — R198 Other specified symptoms and signs involving the digestive system and abdomen: Secondary | ICD-10-CM | POA: Diagnosis not present

## 2015-05-03 DIAGNOSIS — Z86010 Personal history of colonic polyps: Secondary | ICD-10-CM | POA: Diagnosis not present

## 2015-05-03 MED ORDER — DEXLANSOPRAZOLE 60 MG PO CPDR: 60.0000 mg | DELAYED_RELEASE_CAPSULE | Freq: Every day | ORAL | Status: DC

## 2015-05-03 NOTE — Patient Instructions (Addendum)
Stop taking pantoprazole.  We have sent the following medications to your pharmacy for you to pick up at your convenience:Dexilant.   You have been scheduled for an endoscopy. Please follow written instructions given to you at your visit today. If you use inhalers (even only as needed), please bring them with you on the day of your procedure  Patient advised to avoid spicy, acidic, citrus, chocolate, mints, fruit and fruit juices.  Limit the intake of caffeine, alcohol and Soda.  Don't exercise too soon after eating.  Don't lie down within 3-4 hours of eating.  Elevate the head of your bed.  Thank you for choosing me and [facility].  Pricilla Riffle. Dagoberto Ligas., MD., Marval Regal  cc: Christella Noa, MD

## 2015-05-03 NOTE — Progress Notes (Signed)
    History of Present Illness: This is a 69 year old female who relates long-term problems with throat clearing and cough. She was recently diagnosed with a right sided pneumonia was treated with a course of antibiotics. She had fevers and shortness of breath and worsening cough. She relates problems with throat clearing and a cough for several years and the symptoms have worsened over the past several months. She notes daily heartburn for the past several months and was treated with a 6 weeks course of pantoprazole with no change in symptoms. Denies weight loss, abdominal pain, constipation, diarrhea, change in stool caliber, melena, hematochezia, nausea, vomiting, dysphagia, chest pain.  Current Medications, Allergies, Past Medical History, Past Surgical History, Family History and Social History were reviewed in [employer] record.  Physical Exam: General: Well developed, well nourished, no acute distress Head: Normocephalic and atraumatic Eyes:  sclerae anicteric, EOMI Ears: Normal auditory acuity Mouth: No deformity or lesions Lungs: Clear throughout to auscultation Heart: Regular rate and rhythm; no murmurs, rubs or bruits Abdomen: Soft, non tender and non distended. No masses, hepatosplenomegaly or hernias noted. Normal Bowel sounds Musculoskeletal: Symmetrical with no gross deformities  Pulses:  Normal pulses noted Extremities: No clubbing, cyanosis, edema or deformities noted Neurological: Alert oriented x 4, grossly nonfocal Psychological:  Alert and cooperative. Normal mood and affect  Assessment and Recommendations:  1. Chronic cough, throat clearing for years. Worsening heartburn for 6 months. No improvement on pantoprazole 40 mg daily for 6 weeks. Possible GERD with LPR however PCP may need to evaluate for pulmonary, ENT, allergy related causes. Change to Dexilant 60 mg daily and closely follow antireflux measures. Schedule EGD. The risks (including  bleeding, perforation, infection, missed lesions, medication reactions and possible hospitalization or surgery if complications occur), benefits, and alternatives to endoscopy with possible biopsy and possible dilation were discussed with the patient and they consent to proceed.   2. Personal history of adenomatous colon polyps. Five-year interval surveillance colonoscopy recommended in August 2019.

## 2015-05-08 ENCOUNTER — Ambulatory Visit (AMBULATORY_SURGERY_CENTER): Payer: Medicare Other | Admitting: Gastroenterology

## 2015-05-08 ENCOUNTER — Encounter: Payer: Self-pay | Admitting: Gastroenterology

## 2015-05-08 VITALS — BP 112/74 | HR 73 | Temp 96.2°F | Resp 24 | Ht <= 58 in | Wt 106.0 lb

## 2015-05-08 DIAGNOSIS — R05 Cough: Secondary | ICD-10-CM

## 2015-05-08 DIAGNOSIS — K3189 Other diseases of stomach and duodenum: Secondary | ICD-10-CM | POA: Diagnosis not present

## 2015-05-08 DIAGNOSIS — R198 Other specified symptoms and signs involving the digestive system and abdomen: Secondary | ICD-10-CM

## 2015-05-08 DIAGNOSIS — R6889 Other general symptoms and signs: Secondary | ICD-10-CM

## 2015-05-08 DIAGNOSIS — K219 Gastro-esophageal reflux disease without esophagitis: Secondary | ICD-10-CM | POA: Diagnosis not present

## 2015-05-08 DIAGNOSIS — R053 Chronic cough: Secondary | ICD-10-CM

## 2015-05-08 DIAGNOSIS — K2960 Other gastritis without bleeding: Secondary | ICD-10-CM

## 2015-05-08 DIAGNOSIS — R0989 Other specified symptoms and signs involving the circulatory and respiratory systems: Secondary | ICD-10-CM

## 2015-05-08 MED ORDER — SODIUM CHLORIDE 0.9 % IV SOLN: 500.0000 mL | INTRAVENOUS | Status: DC

## 2015-05-08 NOTE — Patient Instructions (Signed)
Discharge instructions given. Handouts on gastritis and a hiatal hernia. Resume previous medications. YOU HAD AN ENDOSCOPIC PROCEDURE TODAY AT [facility]:   Refer to the procedure report that was given to you for any specific questions about what was found during the examination.  If the procedure report does not answer your questions, please call your gastroenterologist to clarify.  If you requested that your care partner not be given the details of your procedure findings, then the procedure report has been included in a sealed envelope for you to review at your convenience later.  YOU SHOULD EXPECT: Some feelings of bloating in the abdomen. Passage of more gas than usual.  Walking can help get rid of the air that was put into your GI tract during the procedure and reduce the bloating. If you had a lower endoscopy (such as a colonoscopy or flexible sigmoidoscopy) you may notice spotting of blood in your stool or on the toilet paper. If you underwent a bowel prep for your procedure, you may not have a normal bowel movement for a few days.  Please Note:  You might notice some irritation and congestion in your nose or some drainage.  This is from the oxygen used during your procedure.  There is no need for concern and it should clear up in a day or so.  SYMPTOMS TO REPORT IMMEDIATELY:    Following upper endoscopy (EGD)  Vomiting of blood or coffee ground material  New chest pain or pain under the shoulder blades  Painful or persistently difficult swallowing  New shortness of breath  Fever of 100F or higher  Black, tarry-looking stools  For urgent or emergent issues, a gastroenterologist can be reached at any hour by calling [phone].   DIET: Your first meal following the procedure should be a small meal and then it is ok to progress to your normal diet. Heavy or fried foods are harder to digest and may make you feel nauseous or bloated.  Likewise, meals heavy in dairy  and vegetables can increase bloating.  Drink plenty of fluids but you should avoid alcoholic beverages for 24 hours.  ACTIVITY:  You should plan to take it easy for the rest of today and you should NOT DRIVE or use heavy machinery until tomorrow (because of the sedation medicines used during the test).    FOLLOW UP: Our staff will call the number listed on your records the next business day following your procedure to check on you and address any questions or concerns that you may have regarding the information given to you following your procedure. If we do not reach you, we will leave a message.  However, if you are feeling well and you are not experiencing any problems, there is no need to return our call.  We will assume that you have returned to your regular daily activities without incident.  If any biopsies were taken you will be contacted by phone or by letter within the next 1-3 weeks.  Please call us at [phone] if you have not heard about the biopsies in 3 weeks.    SIGNATURES/CONFIDENTIALITY: You and/or your care partner have signed paperwork which will be entered into your electronic medical record.  These signatures attest to the fact that that the information above on your After Visit Summary has been reviewed and is understood.  Full responsibility of the confidentiality of this discharge information lies with you and/or your care-partner.

## 2015-05-08 NOTE — Progress Notes (Signed)
A/ox3 pleased with MAC, report to Celia RN 

## 2015-05-08 NOTE — Progress Notes (Signed)
Called to room to assist during endoscopic procedure.  Patient ID and intended procedure confirmed with present staff. Received instructions for my participation in the procedure from the performing physician.  

## 2015-05-08 NOTE — Progress Notes (Signed)
Dental advisory given to patient 

## 2015-05-08 NOTE — Op Note (Signed)
[place]  [address] [place], [id number]   ENDOSCOPY PROCEDURE REPORT  PATIENT: Carla Alvarado, Carla Alvarado  MR#: [id number] BIRTHDATE: 1946-03-22 , 17  yrs. old GENDER: female ENDOSCOPIST: Ladene Artist, MD, [facility] REFERRED BY:  Orpah Melter, M.D. PROCEDURE DATE:  05/08/2015 PROCEDURE:  EGD w/ biopsy ASA CLASS:     Class II INDICATIONS:  history of esophageal reflux and chronic cough, chronic throat clearing. MEDICATIONS: Monitored anesthesia care and Propofol 120 mg IV TOPICAL ANESTHETIC: none DESCRIPTION OF PROCEDURE: After the risks benefits and alternatives of the procedure were thoroughly explained, informed consent was obtained.  The LB [id number] endoscope was introduced through the mouth and advanced to the second portion of the duodenum , Without limitations.  The instrument was slowly withdrawn as the mucosa was fully examined.   STOMACH: Mild erosive gastritis was found in the gastric antrum. Multiple biopsies were performed.   The stomach otherwise appeared normal. ESOPHAGUS: The mucosa of the esophagus appeared normal. DUODENUM: The duodenal mucosa showed no abnormalities in the buld and 3nd duodenum.  Retroflexed views revealed a small hiatal hernia.   The scope was then withdrawn from the patient and the procedure completed.  COMPLICATIONS: There were no immediate complications.  ENDOSCOPIC IMPRESSION: 1.   Erosive gastritis in the gastric antrum; multiple biopsies performed 2.   Small hiatal hernia 3.   The EGD otherwise appeared normal  RECOMMENDATIONS: 1.  Anti-reflux regimen 2.  Await pathology results 3.  Avoid/mininize ASA/NSAIDS 4.  Dexilant 60 mg po qam as prescribed for 3 months. If symptoms resolve continue Dexilant 60 qam long term. If symptoms do not resolve follow up with your PCP to evaluate for non GI causes of throat clearing and cough  eSigned:  Ladene Artist, MD, [facility] 05/08/2015 10:18 AM

## 2015-05-09 ENCOUNTER — Telehealth: Payer: Self-pay | Admitting: Emergency Medicine

## 2015-05-09 NOTE — Telephone Encounter (Signed)
Left message, identifier present 

## 2015-05-21 ENCOUNTER — Encounter: Payer: Self-pay | Admitting: Gastroenterology

## 2015-05-24 DIAGNOSIS — J189 Pneumonia, unspecified organism: Secondary | ICD-10-CM | POA: Diagnosis not present

## 2015-06-03 ENCOUNTER — Ambulatory Visit: Payer: Medicare Other

## 2015-06-09 ENCOUNTER — Encounter (HOSPITAL_BASED_OUTPATIENT_CLINIC_OR_DEPARTMENT_OTHER): Payer: Self-pay | Admitting: Emergency Medicine

## 2015-06-09 ENCOUNTER — Emergency Department (HOSPITAL_BASED_OUTPATIENT_CLINIC_OR_DEPARTMENT_OTHER): Payer: Medicare Other

## 2015-06-09 ENCOUNTER — Emergency Department (HOSPITAL_BASED_OUTPATIENT_CLINIC_OR_DEPARTMENT_OTHER)
Admission: EM | Admit: 2015-06-09 | Discharge: 2015-06-10 | Disposition: A | Discharge status: Home / Self Care | Payer: Medicare Other | Attending: Emergency Medicine | Admitting: Emergency Medicine

## 2015-06-09 DIAGNOSIS — E7800 Pure hypercholesterolemia, unspecified: Secondary | ICD-10-CM | POA: Insufficient documentation

## 2015-06-09 DIAGNOSIS — Z8739 Personal history of other diseases of the musculoskeletal system and connective tissue: Secondary | ICD-10-CM | POA: Insufficient documentation

## 2015-06-09 DIAGNOSIS — Z87448 Personal history of other diseases of urinary system: Secondary | ICD-10-CM | POA: Insufficient documentation

## 2015-06-09 DIAGNOSIS — Z86018 Personal history of other benign neoplasm: Secondary | ICD-10-CM | POA: Insufficient documentation

## 2015-06-09 DIAGNOSIS — E039 Hypothyroidism, unspecified: Secondary | ICD-10-CM | POA: Insufficient documentation

## 2015-06-09 DIAGNOSIS — R1011 Right upper quadrant pain: Secondary | ICD-10-CM

## 2015-06-09 DIAGNOSIS — Z90710 Acquired absence of both cervix and uterus: Secondary | ICD-10-CM | POA: Insufficient documentation

## 2015-06-09 DIAGNOSIS — F329 Major depressive disorder, single episode, unspecified: Secondary | ICD-10-CM | POA: Insufficient documentation

## 2015-06-09 DIAGNOSIS — R109 Unspecified abdominal pain: Secondary | ICD-10-CM | POA: Diagnosis present

## 2015-06-09 DIAGNOSIS — Z792 Long term (current) use of antibiotics: Secondary | ICD-10-CM | POA: Diagnosis not present

## 2015-06-09 DIAGNOSIS — K2970 Gastritis, unspecified, without bleeding: Secondary | ICD-10-CM | POA: Diagnosis not present

## 2015-06-09 DIAGNOSIS — Z79899 Other long term (current) drug therapy: Secondary | ICD-10-CM | POA: Insufficient documentation

## 2015-06-09 DIAGNOSIS — Z87442 Personal history of urinary calculi: Secondary | ICD-10-CM | POA: Insufficient documentation

## 2015-06-09 DIAGNOSIS — R1013 Epigastric pain: Secondary | ICD-10-CM | POA: Diagnosis not present

## 2015-06-09 DIAGNOSIS — Z872 Personal history of diseases of the skin and subcutaneous tissue: Secondary | ICD-10-CM | POA: Insufficient documentation

## 2015-06-09 DIAGNOSIS — Z9889 Other specified postprocedural states: Secondary | ICD-10-CM | POA: Diagnosis not present

## 2015-06-09 LAB — COMPREHENSIVE METABOLIC PANEL
ALBUMIN: 4.3 g/dL (ref 3.5–5.0)
ALT: 15 U/L (ref 14–54)
AST: 21 U/L (ref 15–41)
Alkaline Phosphatase: 66 U/L (ref 38–126)
Anion gap: 8 (ref 5–15)
BUN: 18 mg/dL (ref 6–20)
CHLORIDE: 103 mmol/L (ref 101–111)
CO2: 28 mmol/L (ref 22–32)
CREATININE: 0.72 mg/dL (ref 0.44–1.00)
Calcium: 9.3 mg/dL (ref 8.9–10.3)
GFR calc non Af Amer: >60 mL/min (ref >60)
GLUCOSE: 103 mg/dL — AB (ref 65–99)
Potassium: 3.6 mmol/L (ref 3.5–5.1)
SODIUM: 139 mmol/L (ref 135–145)
Total Bilirubin: 0.6 mg/dL (ref 0.3–1.2)
Total Protein: 7.3 g/dL (ref 6.5–8.1)

## 2015-06-09 LAB — CBC
HCT: 39.5 % (ref 36.0–46.0)
Hemoglobin: 13.1 g/dL (ref 12.0–15.0)
MCH: 30.7 pg (ref 26.0–34.0)
MCHC: 33.2 g/dL (ref 30.0–36.0)
MCV: 92.5 fL (ref 78.0–100.0)
PLATELETS: 298 10*3/uL (ref 150–400)
RBC: 4.27 MIL/uL (ref 3.87–5.11)
RDW: 12 % (ref 11.5–15.5)
WBC: 10.2 10*3/uL (ref 4.0–10.5)

## 2015-06-09 LAB — URINALYSIS, ROUTINE W REFLEX MICROSCOPIC
Bilirubin Urine: NEGATIVE
GLUCOSE, UA: NEGATIVE mg/dL
Hgb urine dipstick: NEGATIVE
KETONES UR: NEGATIVE mg/dL
LEUKOCYTES UA: NEGATIVE
Nitrite: NEGATIVE
PH: 6 (ref 5.0–8.0)
Protein, ur: NEGATIVE mg/dL
SPECIFIC GRAVITY, URINE: 1.02 (ref 1.005–1.030)

## 2015-06-09 LAB — LIPASE, BLOOD: LIPASE: 30 U/L (ref 11–51)

## 2015-06-09 MED ORDER — ONDANSETRON 4 MG PO TBDP: ORAL_TABLET | ORAL | Status: DC

## 2015-06-09 MED ORDER — SUCRALFATE 1 G PO TABS: 1.0000 g | ORAL_TABLET | Freq: Three times a day (TID) | ORAL | Status: DC

## 2015-06-09 MED ORDER — ONDANSETRON HCL 4 MG/2ML IJ SOLN
INTRAMUSCULAR | Status: AC
  Filled 2015-06-09: qty 2

## 2015-06-09 MED ORDER — MORPHINE SULFATE (PF) 2 MG/ML IV SOLN
2.0000 mg | Freq: Once | INTRAVENOUS | Status: AC
  Administered 2015-06-09: 2 mg via INTRAVENOUS
  Filled 2015-06-09: qty 1

## 2015-06-09 MED ORDER — ONDANSETRON HCL 4 MG/2ML IJ SOLN
4.0000 mg | Freq: Once | INTRAMUSCULAR | Status: AC | PRN
  Administered 2015-06-09: 4 mg via INTRAVENOUS
  Filled 2015-06-09: qty 2

## 2015-06-09 MED ORDER — PANTOPRAZOLE SODIUM 40 MG IV SOLR
40.0000 mg | Freq: Once | INTRAVENOUS | Status: AC
  Administered 2015-06-09: 40 mg via INTRAVENOUS
  Filled 2015-06-09: qty 40

## 2015-06-09 MED ORDER — ONDANSETRON HCL 4 MG/2ML IJ SOLN
4.0000 mg | Freq: Once | INTRAMUSCULAR | Status: AC
  Administered 2015-06-09: 4 mg via INTRAVENOUS

## 2015-06-09 NOTE — ED Notes (Signed)
Pt reports acute onset of abdominal pain this evening that radiates to back, + nausea, no vomiting no sob

## 2015-06-09 NOTE — ED Provider Notes (Addendum)
CSN: [id number]     Arrival date & time 06/09/15  2003 History  By signing my name below, I, Rayna Sexton, attest that this documentation has been prepared under the direction and in the presence of Malvin Johns, MD. Electronically Signed: Rayna Sexton, ED Scribe. 06/09/2015. 9:05 PM.    Chief Complaint  Patient presents with  . Abdominal Pain   The history is provided by the patient and the spouse. No language interpreter was used.    HPI Comments: Carla Alvarado is a 69 y.o. female with a hx of kidney stones who presents to the Emergency Department complaining of constant, waxing and waning, moderate, sudden onset, central abd pain that began at 6:00 PM. She notes the pain began before she ate tonight resulting in associated decreased appetite and nausea. Pt notes a radiation of pain to her back. She denies a SHx to her abd. Pt denies fevers, chills, difficulty urinating, vomiting, diarrhea, CP, SOB or any other associated symptoms at this time.   Past Medical History  Diagnosis Date  . Hypercholesteremia   . Lichen sclerosus   . Osteopenia 08/2013    T score -1.5 FRAX 8.4%/1.0%  . Hypothyroid   . Tubular adenoma of colon 01/2013  . History of renal stone   . Depression   . Interstitial cystitis    Past Surgical History  Procedure Laterality Date  . Cesarean section    . Vaginal hysterectomy  1990  . Breast papilloma     Family History  Problem Relation Age of Onset  . Hypertension Mother   . Colon cancer Mother     mets from breast cancer  . Heart disease Mother   . Breast cancer Mother     Age 35, mets all over   . Heart disease Brother   . Diabetes Maternal Grandmother   . Heart disease Maternal Grandfather   . Breast cancer Maternal Aunt     Age 32  . Breast cancer Paternal Aunt     Age 49  . Breast cancer Maternal Aunt     Age 45   Social History  Substance Use Topics  . Smoking status: Never Smoker   . Smokeless tobacco: Never Used  . Alcohol Use:  Yes     Comment: occassionally   OB History    Gravida Para Term Preterm AB TAB SAB Ectopic Multiple Living   4 2 2  2  2   2      Review of Systems  Constitutional: Positive for appetite change. Negative for fever, chills, diaphoresis and fatigue.  HENT: Negative for congestion, rhinorrhea and sneezing.   Eyes: Negative.   Respiratory: Negative for cough, chest tightness and shortness of breath.   Cardiovascular: Negative for chest pain and leg swelling.  Gastrointestinal: Positive for nausea and abdominal pain. Negative for vomiting, diarrhea and blood in stool.  Genitourinary: Negative for frequency, hematuria, flank pain and difficulty urinating.  Musculoskeletal: Negative for back pain and arthralgias.  Skin: Negative for rash.  Neurological: Negative for dizziness, speech difficulty, weakness, numbness and headaches.   Allergies  Levothyroxine sodium  Home Medications   Prior to Admission medications   Medication Sig Start Date End Date Taking? Authorizing Provider  dexlansoprazole (DEXILANT) 60 MG capsule Take 1 capsule (60 mg total) by mouth daily. 05/03/15  Yes Ladene Artist, MD  diazepam (VALIUM) 2 MG tablet Take 2 mg by mouth at bedtime as needed for anxiety.   Yes Historical Provider, MD  estradiol (ESTRACE)  1 MG tablet Take 1 tablet (1 mg total) by mouth daily. Take 1 1/2 tabs daily 06/20/14  Yes Timothy P Fontaine, MD  levothyroxine (SYNTHROID, LEVOTHROID) 88 MCG tablet Take 88 mcg by mouth daily before breakfast.   Yes Historical Provider, MD  LOVASTATIN PO Take 40 mg by mouth.    Yes Historical Provider, MD  Multiple Vitamin (MULTIVITAMIN PO) Take by mouth.     Yes Historical Provider, MD  naratriptan (AMERGE) 2.5 MG tablet as needed. 10/24/13  Yes Historical Provider, MD  sucralfate (CARAFATE) 1 G tablet Take 1 tablet (1 g total) by mouth 4 (four) times daily -  with meals and at bedtime. 06/09/15   Malvin Johns, MD   BP 111/64 mmHg  Pulse 88  Temp(Src) 98.6 F  (37 C) (Oral)  Resp 20  Ht 4\' 11"  (1.499 m)  Wt 105 lb (47.628 kg)  BMI 21.20 kg/m2  SpO2 99% Physical Exam  Constitutional: She is oriented to person, place, and time. She appears well-developed and well-nourished.  HENT:  Head: Normocephalic and atraumatic.  Eyes: Pupils are equal, round, and reactive to light.  Neck: Normal range of motion. Neck supple.  Cardiovascular: Normal rate, regular rhythm and normal heart sounds.   Pulmonary/Chest: Effort normal and breath sounds normal. No respiratory distress. She has no wheezes. She has no rales. She exhibits no tenderness.  Abdominal: Soft. Bowel sounds are normal. There is tenderness. There is no rebound and no guarding.  Mild tenderness to the epigastrium   Musculoskeletal: Normal range of motion. She exhibits no edema.  Lymphadenopathy:    She has no cervical adenopathy.  Neurological: She is alert and oriented to person, place, and time.  Skin: Skin is warm and dry. No rash noted.  Psychiatric: She has a normal mood and affect.   ED Course  Procedures  DIAGNOSTIC STUDIES: Oxygen Saturation is 100% on RA, normal by my interpretation.    COORDINATION OF CARE: 9:01 PM Pt presents today due to worsening abd pain. Discussed next steps with pt and she agreed to the plan.   Labs Review Labs Reviewed  COMPREHENSIVE METABOLIC PANEL - Abnormal; Notable for the following:    Glucose, Bld 103 (*)    All other components within normal limits  URINALYSIS, ROUTINE W REFLEX MICROSCOPIC (NOT AT [facility])  LIPASE, BLOOD  CBC    Imaging Review US Abdomen Complete  06/09/2015  CLINICAL DATA:  Epigastric pain EXAM: ULTRASOUND ABDOMEN COMPLETE COMPARISON:  None. FINDINGS: Gallbladder: The gallbladder is partially contracted. The gallbladder wall measures 2.7 mm in maximum thickness. Ring down artifact from the gallbladder fundus likely indicates cholesterol polyps. Negative sonographic Murphy's sign. No stones noted. Common bile duct: Diameter:  2.9 mm. Liver: Increased parenchymal echogenicity compatible with hepatic steatosis. IVC: No abnormality visualized. Pancreas: Visualized portion unremarkable. Spleen: Size and appearance within normal limits. Right Kidney: Length: 9.9 cm. Echogenicity within normal limits. No mass or hydronephrosis visualized. Left Kidney: Length: 9.6 cm. Echogenicity within normal limits. No mass or hydronephrosis visualized. Abdominal aorta: No aneurysm visualized. Other findings: None. IMPRESSION: 1. No acute findings. 2. Hepatic steatosis. 3. Cholesterol polyps noted within the gallbladder fundus. Electronically Signed   By: Kerby Moors M.D.   On: 06/09/2015 21:50   I have personally reviewed and evaluated these images and lab results as part of my medical decision-making.   EKG Interpretation None     ED ECG REPORT   Date: 06/09/2015  Rate: 84  Rhythm: normal sinus rhythm  QRS  Axis: normal  Intervals: normal  ST/T Wave abnormalities: nonspecific ST/T changes  Conduction Disutrbances:none  Narrative Interpretation:   Old EKG Reviewed: none available  I have personally reviewed the EKG tracing and agree with the computerized printout as noted.  MDM   Final diagnoses:  RUQ pain  Gastritis    Patient presents with epigastric pain and nausea. She has no evidence of gallbladder disease. Her lipase is normal without evidence of pancreatitis. She is feeling better in the ED. She was given Protonix.  I feel her symptoms are likely related to gastritis. She was discharged home in good condition. She is already on Dexilant.  She was given a prescription for Carafate. She was encouraged to follow-up with her PCP if her symptoms are not improving. She was given instructions to use a bland diet.  Patient did have one episode of vomiting after she drink some water. She was given additional doses Zofran. She's had no ongoing vomiting. She has no abdominal pain. She is tolerating ice chips without any ongoing  nausea. I don't see any other suggestions of obstruction. She was discharged with above instructions. She was advised that if she has ongoing vomiting or onset of abdominal pain she needs to return for reevaluation.  I personally performed the services described in this documentation, which was scribed in my presence.  The recorded information has been reviewed and considered.    Malvin Johns, MD 06/09/15 [id number]  Malvin Johns, MD 06/09/15 [id number]

## 2015-06-09 NOTE — ED Notes (Signed)
Patient transported to Ultrasound 

## 2015-06-09 NOTE — Discharge Instructions (Signed)

## 2015-06-09 NOTE — ED Notes (Signed)
Patient transported to X-ray 

## 2015-06-25 ENCOUNTER — Encounter: Payer: Medicare Other | Admitting: Gynecology

## 2015-06-26 ENCOUNTER — Encounter: Payer: Self-pay | Admitting: Gynecology

## 2015-06-26 ENCOUNTER — Ambulatory Visit
Admission: RE | Admit: 2015-06-26 | Discharge: 2015-06-26 | Disposition: A | Discharge status: Home / Self Care | Payer: Medicare HMO | Source: Ambulatory Visit

## 2015-06-26 ENCOUNTER — Ambulatory Visit (INDEPENDENT_AMBULATORY_CARE_PROVIDER_SITE_OTHER): Payer: Medicare HMO | Admitting: Gynecology

## 2015-06-26 VITALS — BP 116/76 | Ht 59.0 in | Wt 104.0 lb

## 2015-06-26 DIAGNOSIS — L900 Lichen sclerosus et atrophicus: Secondary | ICD-10-CM

## 2015-06-26 DIAGNOSIS — Z1231 Encounter for screening mammogram for malignant neoplasm of breast: Secondary | ICD-10-CM

## 2015-06-26 DIAGNOSIS — M8580 Other specified disorders of bone density and structure, unspecified site: Secondary | ICD-10-CM

## 2015-06-26 DIAGNOSIS — Z79890 Hormone replacement therapy (postmenopausal): Secondary | ICD-10-CM | POA: Diagnosis not present

## 2015-06-26 DIAGNOSIS — N952 Postmenopausal atrophic vaginitis: Secondary | ICD-10-CM

## 2015-06-26 DIAGNOSIS — Z01419 Encounter for gynecological examination (general) (routine) without abnormal findings: Secondary | ICD-10-CM

## 2015-06-26 MED ORDER — ESTRADIOL 1 MG PO TABS: 1.0000 mg | ORAL_TABLET | Freq: Every day | ORAL | Status: DC

## 2015-06-26 NOTE — Patient Instructions (Signed)

## 2015-06-26 NOTE — Progress Notes (Signed)
Carla Alvarado 02/29/1946 [id number]        69 y.o.  [id number]  for breast and pelvic exam  Past medical history,surgical history, problem list, medications, allergies, family history and social history were all reviewed and documented as reviewed in the EPIC chart.  ROS:  Performed with pertinent positives and negatives included in the history, assessment and plan.   Additional significant findings :  none   Exam: [name] assistant Filed Vitals:   06/26/15 0857  BP: 116/76  Height: 4\' 11"  (1.499 m)  Weight: 104 lb (47.174 kg)   General appearance:  Normal affect, orientation and appearance. Skin: Grossly normal HEENT: Without gross lesions.  No cervical or supraclavicular adenopathy. Thyroid normal.  Lungs:  Clear without wheezing, rales or rhonchi Cardiac: RR, without RMG Abdominal:  Soft, nontender, without masses, guarding, rebound, organomegaly or hernia Breasts:  Examined lying and sitting without masses, retractions, discharge or axillary adenopathy. Pelvic:  Ext/BUS/vagina with atrophic changes. Symmetrical vulvar blanching consistent with history of lichen sclerosis  Adnexa  Without masses or tenderness    Anus and perineum  Normal   Rectovaginal  Normal sphincter tone without palpated masses or tenderness.    Assessment/Plan:  70 y.o. [name] female for breast and pelvic exam  1. Postmenopausal/atrophic genital changes/HRT. Status post [facility] for adenomyosis 1990. Continues on estradiol 1-1/2 mg daily. Actually ran out and is having significant hot flushes and night sweats. She did decrease to 1 mg and said that she tolerated this well. I again reviewed the whole issue of ERT and increased risk of stroke heart attack DVT and breast cancer. The issue of lowest dose for shortest period of time recommendations reviewed. Patient was to continue at this point and she'll stay on the 1 mg estradiol daily. Refill 1 year provided. 2. Lichen sclerosis. Documented in Dr.  Valeta Harms note. Had used Temovate in the past but no longer is having symptoms and does not use this. Exam consistent with diagnoses. No concerning lesions or changes. Follow up if symptoms develop. 3. Osteopenia.  DEXA 2015 T score -1.5 FRAX 8%/1%. Stable from prior DEXA. She continues on ERT. Will plan repeat DEXA in another year or 2. Increased calcium vitamin D reviewed. 4. Pap smear 05/2014 negative. No Pap smear done today. No history of significant abnormal Pap smears. Options to stop screening altogether she is status post hysterectomy for benign indications and over the age of 80 per current screening guidelines versus less frequent screening intervals reviewed. Will readdress on an annual basis. 5. Mammography today. Will continue with annual mammography when due. SBE monthly reviewed. 6. Colonoscopy 2014. Repeat at their recommended interval. Does have history of polyps in the past. 7. Health maintenance. No routine blood work done as patient has this done at her primary physician's office. Follow up in one year, sooner as needed.   [name], 9:21 AM 06/26/2015

## 2015-06-27 LAB — URINALYSIS W MICROSCOPIC + REFLEX CULTURE
Bacteria, UA: NONE SEEN [HPF]
Bilirubin Urine: NEGATIVE
Casts: NONE SEEN [LPF]
Crystals: NONE SEEN [HPF]
GLUCOSE, UA: NEGATIVE
HGB URINE DIPSTICK: NEGATIVE
Ketones, ur: NEGATIVE
LEUKOCYTES UA: NEGATIVE
NITRITE: NEGATIVE
PH: 6 (ref 5.0–8.0)
Protein, ur: NEGATIVE
RBC / HPF: NONE SEEN RBC/HPF (ref <=2)
SPECIFIC GRAVITY, URINE: 1.005 (ref 1.001–1.035)
SQUAMOUS EPITHELIAL / LPF: NONE SEEN [HPF] (ref <=5)
WBC, UA: NONE SEEN WBC/HPF (ref <=5)
YEAST: NONE SEEN [HPF]

## 2015-07-24 DIAGNOSIS — C4490 Unspecified malignant neoplasm of skin, unspecified: Secondary | ICD-10-CM | POA: Insufficient documentation

## 2015-07-24 HISTORY — DX: Unspecified malignant neoplasm of skin, unspecified: C44.90

## 2015-08-27 ENCOUNTER — Telehealth: Payer: Self-pay | Admitting: Gastroenterology

## 2015-08-27 NOTE — Telephone Encounter (Signed)
Called [employer] to get a PA on Dexilant at [phone]. Dexilant was approved untl 06/21/16. Called patient and left a message to inform her she can pick it up from the pharmacy.

## 2015-09-24 DIAGNOSIS — G43909 Migraine, unspecified, not intractable, without status migrainosus: Secondary | ICD-10-CM | POA: Diagnosis not present

## 2015-09-24 DIAGNOSIS — E039 Hypothyroidism, unspecified: Secondary | ICD-10-CM | POA: Diagnosis not present

## 2015-09-24 DIAGNOSIS — E7800 Pure hypercholesterolemia, unspecified: Secondary | ICD-10-CM | POA: Diagnosis not present

## 2015-09-24 DIAGNOSIS — C439 Malignant melanoma of skin, unspecified: Secondary | ICD-10-CM | POA: Diagnosis not present

## 2015-09-24 DIAGNOSIS — C4339 Malignant melanoma of other parts of face: Secondary | ICD-10-CM | POA: Diagnosis not present

## 2015-10-09 DIAGNOSIS — C4339 Malignant melanoma of other parts of face: Secondary | ICD-10-CM | POA: Diagnosis not present

## 2015-11-25 DIAGNOSIS — G4700 Insomnia, unspecified: Secondary | ICD-10-CM | POA: Diagnosis not present

## 2015-11-25 DIAGNOSIS — L309 Dermatitis, unspecified: Secondary | ICD-10-CM | POA: Diagnosis not present

## 2015-12-31 DIAGNOSIS — D2271 Melanocytic nevi of right lower limb, including hip: Secondary | ICD-10-CM | POA: Diagnosis not present

## 2015-12-31 DIAGNOSIS — Z08 Encounter for follow-up examination after completed treatment for malignant neoplasm: Secondary | ICD-10-CM | POA: Diagnosis not present

## 2015-12-31 DIAGNOSIS — Z1283 Encounter for screening for malignant neoplasm of skin: Secondary | ICD-10-CM | POA: Diagnosis not present

## 2015-12-31 DIAGNOSIS — Z85820 Personal history of malignant melanoma of skin: Secondary | ICD-10-CM | POA: Diagnosis not present

## 2015-12-31 DIAGNOSIS — D225 Melanocytic nevi of trunk: Secondary | ICD-10-CM | POA: Diagnosis not present

## 2016-02-12 ENCOUNTER — Telehealth: Payer: Self-pay | Admitting: *Deleted

## 2016-02-12 NOTE — Telephone Encounter (Signed)
Prior Authorization for estradiol 1 mg tablet approved via [facility] through 06/21/16.

## 2016-03-11 DIAGNOSIS — Z6820 Body mass index (BMI) 20.0-20.9, adult: Secondary | ICD-10-CM | POA: Diagnosis not present

## 2016-03-11 DIAGNOSIS — C4339 Malignant melanoma of other parts of face: Secondary | ICD-10-CM | POA: Diagnosis not present

## 2016-04-14 DIAGNOSIS — Z6821 Body mass index (BMI) 21.0-21.9, adult: Secondary | ICD-10-CM | POA: Diagnosis not present

## 2016-04-14 DIAGNOSIS — C4339 Malignant melanoma of other parts of face: Secondary | ICD-10-CM | POA: Diagnosis not present

## 2016-05-19 ENCOUNTER — Other Ambulatory Visit: Payer: Self-pay | Admitting: Gynecology

## 2016-05-19 DIAGNOSIS — Z1231 Encounter for screening mammogram for malignant neoplasm of breast: Secondary | ICD-10-CM

## 2016-06-26 ENCOUNTER — Ambulatory Visit
Admission: RE | Admit: 2016-06-26 | Discharge: 2016-06-26 | Disposition: A | Discharge status: Home / Self Care | Payer: Medicare Other | Source: Ambulatory Visit | Attending: Gynecology | Admitting: Gynecology

## 2016-06-26 DIAGNOSIS — Z1231 Encounter for screening mammogram for malignant neoplasm of breast: Secondary | ICD-10-CM | POA: Diagnosis not present

## 2016-06-29 ENCOUNTER — Encounter: Payer: Self-pay | Admitting: Gynecology

## 2016-06-29 ENCOUNTER — Ambulatory Visit (INDEPENDENT_AMBULATORY_CARE_PROVIDER_SITE_OTHER): Payer: Medicare Other | Admitting: Gynecology

## 2016-06-29 VITALS — BP 122/76 | Ht 59.0 in | Wt 107.0 lb

## 2016-06-29 DIAGNOSIS — C439 Malignant melanoma of skin, unspecified: Secondary | ICD-10-CM | POA: Diagnosis not present

## 2016-06-29 DIAGNOSIS — Z01411 Encounter for gynecological examination (general) (routine) with abnormal findings: Secondary | ICD-10-CM

## 2016-06-29 DIAGNOSIS — Z79890 Hormone replacement therapy (postmenopausal): Secondary | ICD-10-CM

## 2016-06-29 DIAGNOSIS — M899 Disorder of bone, unspecified: Secondary | ICD-10-CM | POA: Diagnosis not present

## 2016-06-29 DIAGNOSIS — N952 Postmenopausal atrophic vaginitis: Secondary | ICD-10-CM

## 2016-06-29 DIAGNOSIS — L900 Lichen sclerosus et atrophicus: Secondary | ICD-10-CM | POA: Diagnosis not present

## 2016-06-29 MED ORDER — ESTRADIOL 10 MCG VA TABS: 1.0000 | ORAL_TABLET | VAGINAL | 11 refills | Status: DC

## 2016-06-29 NOTE — Progress Notes (Signed)
Carla Alvarado 19-Jan-1946 [id number]        71 y.o.  [id number] for breast and pelvic exam. Patient also complaining of perineal fragility with tenderness in the perivaginal tissues. Recently divorced and anticipating sexual activity and worried that this could be an issue. History of lichen sclerosis. Does not use clobetasol cream she does not feel that as needed. She's not having itching or spontaneous irritation but more fragility with wiping. No discharge or vaginal odor. Currently on estradiol 1 mg daily. Not having significant hot flushes or night sweats. Was diagnosed with melanoma of the forehead this past year. Currently being followed on no treatment. Status post [facility] in the past for adenomyosis 1990  Past medical history,surgical history, problem list, medications, allergies, family history and social history were all reviewed and documented as reviewed in the EPIC chart.  ROS:  Performed with pertinent positives and negatives included in the history, assessment and plan.   Additional significant findings :  None   Exam: [name] assistant Vitals:   06/29/16 0836  BP: 122/76  Weight: 107 lb (48.5 kg)  Height: 4\' 11"  (1.499 m)   Body mass index is 21.61 kg/m.  General appearance:  Normal affect, orientation and appearance. Skin: Grossly normal HEENT: Without gross lesions.  No cervical or supraclavicular adenopathy. Thyroid normal.  Lungs:  Clear without wheezing, rales or rhonchi Cardiac: RR, without RMG Abdominal:  Soft, nontender, without masses, guarding, rebound, organomegaly or hernia Breasts:  Examined lying and sitting without masses, retractions, discharge or axillary adenopathy. Pelvic:  Ext, BUS, Vagina with atrophic changes. Symmetrical blanching of the skin from periclitoral to posterior fourchette consistent with lichen sclerosis. No specific lesions.  Adnexa without masses or tenderness    Anus and perineum normal   Rectovaginal normal sphincter tone  without palpated masses or tenderness.    Assessment/Plan:  71 y.o. [name] female for breast and pelvic exam .   1. Postmenopausal/atrophic genital changes/ERT. Complaining of vaginal fragility. No spontaneous irritation or itching but tenderness with wiping. Anticipates intercourse and is afraid this could be an issue. Currently on estradiol 1 mg daily. Recent diagnosis of melanoma this past year. I reviewed with the patient the issues of melanoma being an estrogen receptor positive tumor and whether continuing on estrogen after a diagnosis of melanoma is wise. I reviewed the risks of ERT again to include stroke heart attack DVT breast cancer. Benefits as far symptom relief possible cardiovascular/bone health given her history of osteopenia also reviewed. Has history of lichen sclerosis and whether this is contributing to her vaginal symptoms also discussed. Certainly very complex picture when discussing the risks/benefits of ERT. I had a lengthy discussion with her and we've ultimately decided to wean from her ERT. She'll take 0.5 mg daily for one month and then every other day for 2 weeks and then stop. She is very afraid that this could affect her vaginal health and I have suggested we start Vagifem 10 g twice weekly now. Issues of absorption and contributing to all of the risks as discussed above also reviewed and she is comfortable with starting this accepting the risks. I've asked him to come back in 2 months and we'll reevaluate the situation. Whether will add clobetasol with this if she continues to have vaginal irritation will be decided. 2. DEXA 2015 T score -1.5 FRAX 8%/1%. Stable from prior DEXA. Schedule DEXA now at 3 year interval and patient will follow up for this. 3. Pap smear 05/2014. No Pap  smear done today. No history of significant abnormal Pap smears. Options to stop screening altogether per current screening guidelines based on age and hysterectomy history reviewed. Will readdress on  annual basis. 4. Mammography 06/25/2016. Continue with annual mammography next year. SBE monthly reviewed. 5. Colonoscopy 2014. Repeat at their recommended interval. 6. Health maintenance. No routine lab work done as patient does this elsewhere. Follow up in 2 months for reexamination. Follow up for bone density as scheduled. Follow up for annual exam in one year.   15 minutes of my time in excess of her breast and pelvic exam was spent in direct face to face counseling and coordination of care in regards to her HRT, vaginal symptoms and history of melanoma with change in her management plan and new prescription provided.   [name], 9:13 AM 06/29/2016

## 2016-06-29 NOTE — Patient Instructions (Signed)
Follow up for bone density as scheduled  Follow up for reexamination in 2 months

## 2016-07-07 DIAGNOSIS — Z1283 Encounter for screening for malignant neoplasm of skin: Secondary | ICD-10-CM | POA: Diagnosis not present

## 2016-07-07 DIAGNOSIS — Z85820 Personal history of malignant melanoma of skin: Secondary | ICD-10-CM | POA: Diagnosis not present

## 2016-07-07 DIAGNOSIS — Z08 Encounter for follow-up examination after completed treatment for malignant neoplasm: Secondary | ICD-10-CM | POA: Diagnosis not present

## 2016-07-21 ENCOUNTER — Ambulatory Visit (INDEPENDENT_AMBULATORY_CARE_PROVIDER_SITE_OTHER): Payer: Medicare Other

## 2016-07-21 ENCOUNTER — Telehealth: Payer: Self-pay | Admitting: Gynecology

## 2016-07-21 DIAGNOSIS — M899 Disorder of bone, unspecified: Secondary | ICD-10-CM

## 2016-07-21 NOTE — Telephone Encounter (Signed)
Tell patient that her most recent bone density does show loss of calcium from all measured sites. Her calculated hip fracture risk is slightly increased. It is technically within the recommended start medication range. Recommend office visit to discuss options.

## 2016-07-22 NOTE — Telephone Encounter (Signed)
Left message for pt to call.

## 2016-07-23 NOTE — Telephone Encounter (Signed)
Pt informed with the below note, transferred to front desk.  

## 2016-07-28 ENCOUNTER — Ambulatory Visit (INDEPENDENT_AMBULATORY_CARE_PROVIDER_SITE_OTHER): Payer: Medicare Other | Admitting: Gynecology

## 2016-07-28 ENCOUNTER — Encounter: Payer: Self-pay | Admitting: Gynecology

## 2016-07-28 VITALS — BP 120/76

## 2016-07-28 DIAGNOSIS — M8580 Other specified disorders of bone density and structure, unspecified site: Secondary | ICD-10-CM

## 2016-07-28 DIAGNOSIS — E559 Vitamin D deficiency, unspecified: Secondary | ICD-10-CM

## 2016-07-28 DIAGNOSIS — M8589 Other specified disorders of bone density and structure, multiple sites: Secondary | ICD-10-CM | POA: Diagnosis not present

## 2016-07-28 NOTE — Patient Instructions (Signed)
Office will call you with vitamin D level.

## 2016-07-28 NOTE — Progress Notes (Signed)
    Carla Alvarado Jul 31, 1945 [id number]        71 y.o.  [id number] presents to discuss her most recent DEXA which shows T score -2.1, statistically significant loss of 5%, temperature sent, 8% at measured sites with FRAX 12%/3.4%  Past medical history,surgical history, problem list, medications, allergies, family history and social history were all reviewed and documented in the EPIC chart.  Directed ROS with pertinent positives and negatives documented in the history of present illness/assessment and plan.  Exam: Vitals:   07/28/16 1113  BP: 120/76   General appearance:  Normal  Assessment/Plan:  71 y.o. [id number] With osteopenia and elevated FRAX of the hip at 3.4%. Does show statistically significant loss at all measured sites from prior DEXA. Options for management reviewed to include maximizing nonpharmacologic to include weightbearing exercise, calcium supplementation and vitamin D supplementation. Alternatives to include medication reviewed to include bisphosphonates, Prolia, Evista, HRT, Forteo. The risks/benefits of each choice discussed as well as side effect profiles. Osteonecrosis of the jaw, atypical fractures particularly with prolonged use, GERD, rashes and infections all discussed. Patient has a history of low vitamin D in the past. Will check baseline vitamin D today. At this point patient prefers to reinitiate the weightbearing exercise program admitting that she is really not exercised recently. Also to make sure she is maximizing calcium vitamin D and then recheck a bone density in 2 years. Risks of continued bone loss with increased fracture risk reviewed. At this point patient's comfortable with this approach.  Greater than 50% of my time was spent in direct face to face counseling and coordination of care with the patient.    [name], 12:36 PM 07/28/2016

## 2016-07-29 ENCOUNTER — Other Ambulatory Visit: Payer: Self-pay | Admitting: Gynecology

## 2016-07-29 DIAGNOSIS — E559 Vitamin D deficiency, unspecified: Secondary | ICD-10-CM

## 2016-07-29 LAB — VITAMIN D 25 HYDROXY (VIT D DEFICIENCY, FRACTURES): VIT D 25 HYDROXY: 25 ng/mL — AB (ref 30–100)

## 2016-08-06 ENCOUNTER — Other Ambulatory Visit: Payer: Self-pay

## 2016-08-06 DIAGNOSIS — R6889 Other general symptoms and signs: Secondary | ICD-10-CM

## 2016-08-06 DIAGNOSIS — R12 Heartburn: Secondary | ICD-10-CM

## 2016-08-06 DIAGNOSIS — R05 Cough: Secondary | ICD-10-CM

## 2016-08-06 DIAGNOSIS — R0989 Other specified symptoms and signs involving the circulatory and respiratory systems: Secondary | ICD-10-CM

## 2016-08-06 DIAGNOSIS — R059 Cough, unspecified: Secondary | ICD-10-CM

## 2016-08-06 MED ORDER — DEXLANSOPRAZOLE 60 MG PO CPDR: 60.0000 mg | DELAYED_RELEASE_CAPSULE | Freq: Every day | ORAL | 0 refills | Status: DC

## 2016-08-24 ENCOUNTER — Ambulatory Visit: Payer: Medicare Other | Admitting: Gynecology

## 2016-09-03 DIAGNOSIS — M25562 Pain in left knee: Secondary | ICD-10-CM | POA: Diagnosis not present

## 2016-09-03 DIAGNOSIS — M25561 Pain in right knee: Secondary | ICD-10-CM | POA: Diagnosis not present

## 2016-09-14 ENCOUNTER — Other Ambulatory Visit: Payer: Self-pay | Admitting: Gastroenterology

## 2016-09-14 DIAGNOSIS — R0989 Other specified symptoms and signs involving the circulatory and respiratory systems: Secondary | ICD-10-CM

## 2016-09-14 DIAGNOSIS — R6889 Other general symptoms and signs: Secondary | ICD-10-CM

## 2016-09-14 DIAGNOSIS — R12 Heartburn: Secondary | ICD-10-CM

## 2016-09-14 DIAGNOSIS — R05 Cough: Secondary | ICD-10-CM

## 2016-09-14 DIAGNOSIS — R059 Cough, unspecified: Secondary | ICD-10-CM

## 2016-09-23 DIAGNOSIS — L908 Other atrophic disorders of skin: Secondary | ICD-10-CM | POA: Diagnosis not present

## 2016-09-23 DIAGNOSIS — Z6821 Body mass index (BMI) 21.0-21.9, adult: Secondary | ICD-10-CM | POA: Diagnosis not present

## 2016-09-23 DIAGNOSIS — C4339 Malignant melanoma of other parts of face: Secondary | ICD-10-CM | POA: Diagnosis not present

## 2016-09-25 ENCOUNTER — Ambulatory Visit (INDEPENDENT_AMBULATORY_CARE_PROVIDER_SITE_OTHER): Payer: Medicare Other | Admitting: Gynecology

## 2016-09-25 ENCOUNTER — Encounter: Payer: Self-pay | Admitting: Gynecology

## 2016-09-25 VITALS — BP 120/70

## 2016-09-25 DIAGNOSIS — N952 Postmenopausal atrophic vaginitis: Secondary | ICD-10-CM

## 2016-09-25 DIAGNOSIS — L900 Lichen sclerosus et atrophicus: Secondary | ICD-10-CM

## 2016-09-25 NOTE — Progress Notes (Signed)
    Carla Alvarado [birth year] [id number]        71 y.o.  [id number] presents in follow up.  History of HRT subsequently discontinued after her diagnosis of melanoma. Having significant vaginal dryness and discomfort. Also has a history of lichen sclerosus and used clobetasol cream in the past intermittently. Was started on Vagifem twice weekly tablets and felt better but ran out and presents to discuss treatment options. Status post hysterectomy in the past.  Past medical history,surgical history, problem list, medications, allergies, family history and social history were all reviewed and documented in the EPIC chart.  Directed ROS with pertinent positives and negatives documented in the history of present illness/assessment and plan.  Exam: [name] assistant Vitals:   09/25/16 0859  BP: 120/70   General appearance:  Normal Abdomen soft nontender without masses guarding rebound Pelvic external BUS vagina with significant atrophic changes with fragile mucosa. No lesions. Bimanual without masses or tenderness  Assessment/Plan:  71 y.o. [id number] with significant atrophic changes. Some blanching to suggest lichen sclerosus. Reviewed options with the patient to include reinitiation of vaginal estrogen. Options to include ring, cream, tablets, Osphena discussed. Possible absorption risks with systemic effects also reviewed with her history of melanoma and possible breast and thrombosis. After a lengthy discussion the patient wants to try formulated vaginal estrogen cream [facility] Will go with 3 month supply and she'll call me the end of 3 months to let me know how she's doing. Discussed possibly adding clobetasol if irritation would continue despite this. My preference at this point though would be to address 1 at a time so that we do not confuse what is helping versus not helping. Patient agrees with the plan and will call in several months. She is coming back next  month to have her vitamin D level checked as she has been supplementing her vitamin D.  Greater than 50% of my time was spent in direct face to face counseling and coordination of care with the patient.     [name], 9:14 AM 09/25/2016

## 2016-09-25 NOTE — Patient Instructions (Signed)
Start on the vaginal estrogen cream twice weekly from [facility]. Call me in 3 months to let me know how you are doing and for a refill

## 2016-10-05 ENCOUNTER — Other Ambulatory Visit: Payer: Self-pay | Admitting: Gastroenterology

## 2016-10-05 DIAGNOSIS — R6889 Other general symptoms and signs: Secondary | ICD-10-CM

## 2016-10-05 DIAGNOSIS — R059 Cough, unspecified: Secondary | ICD-10-CM

## 2016-10-05 DIAGNOSIS — R0989 Other specified symptoms and signs involving the circulatory and respiratory systems: Secondary | ICD-10-CM

## 2016-10-05 DIAGNOSIS — R12 Heartburn: Secondary | ICD-10-CM

## 2016-10-05 DIAGNOSIS — R05 Cough: Secondary | ICD-10-CM

## 2016-10-09 DIAGNOSIS — Z85820 Personal history of malignant melanoma of skin: Secondary | ICD-10-CM | POA: Diagnosis not present

## 2016-10-09 DIAGNOSIS — Z1283 Encounter for screening for malignant neoplasm of skin: Secondary | ICD-10-CM | POA: Diagnosis not present

## 2016-10-09 DIAGNOSIS — L821 Other seborrheic keratosis: Secondary | ICD-10-CM | POA: Diagnosis not present

## 2016-10-09 DIAGNOSIS — Z08 Encounter for follow-up examination after completed treatment for malignant neoplasm: Secondary | ICD-10-CM | POA: Diagnosis not present

## 2016-10-09 DIAGNOSIS — L308 Other specified dermatitis: Secondary | ICD-10-CM | POA: Diagnosis not present

## 2016-10-10 ENCOUNTER — Other Ambulatory Visit: Payer: Self-pay | Admitting: Gastroenterology

## 2016-10-10 DIAGNOSIS — R05 Cough: Secondary | ICD-10-CM

## 2016-10-10 DIAGNOSIS — R6889 Other general symptoms and signs: Secondary | ICD-10-CM

## 2016-10-10 DIAGNOSIS — R12 Heartburn: Secondary | ICD-10-CM

## 2016-10-10 DIAGNOSIS — R059 Cough, unspecified: Secondary | ICD-10-CM

## 2016-10-10 DIAGNOSIS — R0989 Other specified symptoms and signs involving the circulatory and respiratory systems: Secondary | ICD-10-CM

## 2016-10-12 DIAGNOSIS — G4700 Insomnia, unspecified: Secondary | ICD-10-CM | POA: Diagnosis not present

## 2016-10-12 DIAGNOSIS — Z23 Encounter for immunization: Secondary | ICD-10-CM | POA: Diagnosis not present

## 2016-10-12 DIAGNOSIS — K219 Gastro-esophageal reflux disease without esophagitis: Secondary | ICD-10-CM | POA: Diagnosis not present

## 2016-10-12 DIAGNOSIS — Z1159 Encounter for screening for other viral diseases: Secondary | ICD-10-CM | POA: Diagnosis not present

## 2016-10-12 DIAGNOSIS — E559 Vitamin D deficiency, unspecified: Secondary | ICD-10-CM | POA: Diagnosis not present

## 2016-10-12 DIAGNOSIS — E7800 Pure hypercholesterolemia, unspecified: Secondary | ICD-10-CM | POA: Diagnosis not present

## 2016-10-12 DIAGNOSIS — G43909 Migraine, unspecified, not intractable, without status migrainosus: Secondary | ICD-10-CM | POA: Diagnosis not present

## 2016-10-12 DIAGNOSIS — Z131 Encounter for screening for diabetes mellitus: Secondary | ICD-10-CM | POA: Diagnosis not present

## 2016-10-12 DIAGNOSIS — F419 Anxiety disorder, unspecified: Secondary | ICD-10-CM | POA: Diagnosis not present

## 2016-10-12 DIAGNOSIS — M8580 Other specified disorders of bone density and structure, unspecified site: Secondary | ICD-10-CM | POA: Diagnosis not present

## 2016-10-12 DIAGNOSIS — Z0000 Encounter for general adult medical examination without abnormal findings: Secondary | ICD-10-CM | POA: Diagnosis not present

## 2016-10-12 DIAGNOSIS — E039 Hypothyroidism, unspecified: Secondary | ICD-10-CM | POA: Diagnosis not present

## 2016-10-14 DIAGNOSIS — Z6821 Body mass index (BMI) 21.0-21.9, adult: Secondary | ICD-10-CM | POA: Diagnosis not present

## 2016-10-14 DIAGNOSIS — C4339 Malignant melanoma of other parts of face: Secondary | ICD-10-CM | POA: Diagnosis not present

## 2016-10-26 ENCOUNTER — Telehealth: Payer: Self-pay | Admitting: *Deleted

## 2016-10-26 ENCOUNTER — Other Ambulatory Visit: Payer: Medicare Other

## 2016-10-26 DIAGNOSIS — E559 Vitamin D deficiency, unspecified: Secondary | ICD-10-CM | POA: Diagnosis not present

## 2016-10-26 NOTE — Telephone Encounter (Signed)
Pt was seen on 09/25/16 and was told Rx for estradiol vaginal cream 0.02% was going to be sent to pharmacy, pharmacy never received Rx. Per note on 09/25/16   "After a lengthy discussion the patient wants to try formulated vaginal estrogen cream twice weekly [facility]. Will go with 3 month supply and she'll call me the end of 3 months to let me know how she's doing."  Rx will be called in, pt aware.

## 2016-10-27 LAB — VITAMIN D 25 HYDROXY (VIT D DEFICIENCY, FRACTURES): Vit D, 25-Hydroxy: 45 ng/mL (ref 30–100)

## 2016-10-27 MED ORDER — NONFORMULARY OR COMPOUNDED ITEM: 0 refills | Status: DC

## 2016-10-28 ENCOUNTER — Other Ambulatory Visit: Payer: Medicare Other

## 2016-12-05 ENCOUNTER — Emergency Department (HOSPITAL_BASED_OUTPATIENT_CLINIC_OR_DEPARTMENT_OTHER): Payer: Medicare Other

## 2016-12-05 ENCOUNTER — Emergency Department (HOSPITAL_BASED_OUTPATIENT_CLINIC_OR_DEPARTMENT_OTHER)
Admission: EM | Admit: 2016-12-05 | Discharge: 2016-12-05 | Disposition: A | Discharge status: Home / Self Care | Payer: Medicare Other | Attending: Emergency Medicine | Admitting: Emergency Medicine

## 2016-12-05 ENCOUNTER — Encounter (HOSPITAL_BASED_OUTPATIENT_CLINIC_OR_DEPARTMENT_OTHER): Payer: Self-pay | Admitting: *Deleted

## 2016-12-05 DIAGNOSIS — E039 Hypothyroidism, unspecified: Secondary | ICD-10-CM | POA: Diagnosis not present

## 2016-12-05 DIAGNOSIS — Z79899 Other long term (current) drug therapy: Secondary | ICD-10-CM | POA: Insufficient documentation

## 2016-12-05 DIAGNOSIS — R0602 Shortness of breath: Secondary | ICD-10-CM

## 2016-12-05 DIAGNOSIS — Z79818 Long term (current) use of other agents affecting estrogen receptors and estrogen levels: Secondary | ICD-10-CM | POA: Insufficient documentation

## 2016-12-05 DIAGNOSIS — R1013 Epigastric pain: Secondary | ICD-10-CM | POA: Insufficient documentation

## 2016-12-05 DIAGNOSIS — R079 Chest pain, unspecified: Secondary | ICD-10-CM | POA: Diagnosis not present

## 2016-12-05 DIAGNOSIS — Z85820 Personal history of malignant melanoma of skin: Secondary | ICD-10-CM | POA: Insufficient documentation

## 2016-12-05 LAB — CBC
HCT: 42.3 % (ref 36.0–46.0)
Hemoglobin: 14.6 g/dL (ref 12.0–15.0)
MCH: 31.7 pg (ref 26.0–34.0)
MCHC: 34.5 g/dL (ref 30.0–36.0)
MCV: 92 fL (ref 78.0–100.0)
Platelets: 275 10*3/uL (ref 150–400)
RBC: 4.6 MIL/uL (ref 3.87–5.11)
RDW: 11.6 % (ref 11.5–15.5)
WBC: 5 10*3/uL (ref 4.0–10.5)

## 2016-12-05 LAB — COMPREHENSIVE METABOLIC PANEL
ALT: 20 U/L (ref 14–54)
AST: 29 U/L (ref 15–41)
Albumin: 4.5 g/dL (ref 3.5–5.0)
Alkaline Phosphatase: 77 U/L (ref 38–126)
Anion gap: 8 (ref 5–15)
BUN: 14 mg/dL (ref 6–20)
CHLORIDE: 100 mmol/L — AB (ref 101–111)
CO2: 29 mmol/L (ref 22–32)
CREATININE: 0.69 mg/dL (ref 0.44–1.00)
Calcium: 10.3 mg/dL (ref 8.9–10.3)
GFR calc Af Amer: >60 mL/min (ref >60)
Glucose, Bld: 97 mg/dL (ref 65–99)
Potassium: 3.7 mmol/L (ref 3.5–5.1)
Sodium: 137 mmol/L (ref 135–145)
Total Bilirubin: 0.3 mg/dL (ref 0.3–1.2)
Total Protein: 7.8 g/dL (ref 6.5–8.1)

## 2016-12-05 LAB — D-DIMER, QUANTITATIVE: D-Dimer, Quant: 0.37 ug/mL-FEU (ref 0.00–0.50)

## 2016-12-05 LAB — LIPASE, BLOOD: Lipase: 26 U/L (ref 11–51)

## 2016-12-05 LAB — TROPONIN I: Troponin I: <0.03 ng/mL (ref <0.03)

## 2016-12-05 MED ORDER — ASPIRIN 81 MG PO CHEW
324.0000 mg | CHEWABLE_TABLET | Freq: Once | ORAL | Status: AC
  Administered 2016-12-05: 324 mg via ORAL
  Filled 2016-12-05: qty 4

## 2016-12-05 MED ORDER — GI COCKTAIL ~~LOC~~
30.0000 mL | Freq: Once | ORAL | Status: AC
  Administered 2016-12-05: 30 mL via ORAL
  Filled 2016-12-05: qty 30

## 2016-12-05 MED ORDER — ASPIRIN 81 MG PO CHEW
CHEWABLE_TABLET | ORAL | Status: AC
  Filled 2016-12-05: qty 1

## 2016-12-05 NOTE — ED Provider Notes (Signed)
[facility] DEPT [facility] Provider Note   CSN: [id number] Arrival date & time: 12/05/16  1545  By signing my name below, I, Theresia Bough, attest that this documentation has been prepared under the direction and in the presence of Tajuana Kniskern, Grayce Sessions, MD. Electronically Signed: Theresia Bough, ED Scribe. 12/05/16. 5:55 PM.  History   Chief Complaint Chief Complaint  Patient presents with  . Shortness of Breath   The history is provided by the patient. No language interpreter was used.   HPI Comments: Carla Alvarado is a 71 y.o. female with a PMHx of GERD, HLD, and skin CA, who presents to the Emergency Department complaining of moderate, unchanged SOB with associated chest pressure; onset 2 days ago. Pt describes the CP as a non-radiating pressure sensation that is exacerbated by exertion. Pt reports associated nausea, fatigue, and epigastric abdominal pain. Per pt, her abdominal pian started last night and has been constant since with no modifying factors. Pt reports hormone replacement therapy. Pt has a FHx of CAD < 45 years old. Pt denies hx of CAD, HTN, DM, DVT/PE. No h/o recent long travel, surgery, fracture, or prolonged immobilization. Pt denies tobacco, EtOH, and IV drug use. Pt denies CP, vomiting, fever, congestion or any other complaints at this time.  Past Medical History:  Diagnosis Date  . Cancer ([place])    Melanoma  . Depression   . History of renal stone   . Hypercholesteremia   . Hypothyroid   . Lichen sclerosus   . Migraine   . Osteopenia 06/2016   T score -2.1 FRAX 12%/3.4%. Statistically significant decline at all measured sites  . Pneumonia   . Tubular adenoma of colon 01/2013    Patient Active Problem List   Diagnosis Date Noted  . HLD (hyperlipidemia) 03/22/2015  . Adult hypothyroidism 03/22/2015  . Hypothyroid   . Hypercholesteremia   . Lichen sclerosus   . Osteopenia     Past Surgical History:  Procedure Laterality Date  . breast papilloma      . CESAREAN SECTION    . MELANOMA EXCISION WITH SENTINEL LYMPH NODE BIOPSY    . VAGINAL HYSTERECTOMY  1990    OB History    Gravida Para Term Preterm AB Living   4 2 2   2 2    SAB TAB Ectopic Multiple Live Births   2               Home Medications    Prior to Admission medications   Medication Sig Start Date End Date Taking? Authorizing Provider  diazepam (VALIUM) 2 MG tablet Take 2 mg by mouth at bedtime as needed for anxiety.   Yes [provider]  Estradiol 10 MCG TABS vaginal tablet Place 1 tablet (10 mcg total) vaginally 2 (two) times a week. 06/29/16  Yes Fontaine, Belinda Block, MD  levothyroxine (SYNTHROID, LEVOTHROID) 88 MCG tablet Take 88 mcg by mouth daily before breakfast.   Yes [provider]  LOVASTATIN PO Take 40 mg by mouth.    Yes [provider]  Multiple Vitamin (MULTIVITAMIN PO) Take by mouth.     Yes [provider]  naratriptan (AMERGE) 2.5 MG tablet as needed. 10/24/13  Yes [provider]  NONFORMULARY OR COMPOUNDED ITEM Estradiol vaginal cream 0.02% insert twice weekly 10/27/16  Yes Fontaine, Belinda Block, MD  dexlansoprazole (DEXILANT) 60 MG capsule Take 1 capsule (60 mg total) by mouth daily. 08/06/16   Ladene Artist, MD  sucralfate (CARAFATE) 1 G tablet  Take 1 tablet (1 g total) by mouth 4 (four) times daily -  with meals and at bedtime. Patient not taking: Reported on 09/25/2016 06/09/15   Malvin Johns, MD    Family History Family History  Problem Relation Age of Onset  . Hypertension Mother   . Colon cancer Mother        mets from breast cancer  . Heart disease Mother   . Breast cancer Mother        Age 44, mets all over   . Heart disease Brother   . Diabetes Maternal Grandmother   . Heart disease Maternal Grandfather   . Breast cancer Maternal Aunt        Age 28  . Breast cancer Paternal Aunt        Age 42  . Breast cancer Maternal Aunt        Age 53    Social History Social History  Substance Use  Topics  . Smoking status: Never Smoker  . Smokeless tobacco: Never Used  . Alcohol use 0.0 oz/week     Comment: occassionally     Allergies   Morphine and related and Levothyroxine sodium   Review of Systems Review of Systems All other systems reviewed and are negative for acute change except as noted in the HPI.  Physical Exam Updated Vital Signs BP 126/77 (BP Location: Left Arm)   Pulse 73   Temp 98.2 F (36.8 C) (Oral)   Resp 16   Ht 4\' 11"  (1.499 m)   Wt 107 lb (48.5 kg)   SpO2 99%   BMI 21.61 kg/m   Physical Exam  Constitutional: She is oriented to person, place, and time. She appears well-developed and well-nourished. No distress.  HENT:  Head: Normocephalic and atraumatic.  Nose: Nose normal.  Eyes: Conjunctivae and EOM are normal. Pupils are equal, round, and reactive to light. Right eye exhibits no discharge. Left eye exhibits no discharge. No scleral icterus.  Neck: Normal range of motion. Neck supple.  Cardiovascular: Normal rate, regular rhythm and normal heart sounds.  Exam reveals no gallop and no friction rub.   No murmur heard. Pulmonary/Chest: Effort normal and breath sounds normal. No stridor. No respiratory distress. She has no wheezes. She has no rales.  Abdominal: Soft. She exhibits no distension. There is no tenderness.  Musculoskeletal: She exhibits no edema or tenderness.  Neurological: She is alert and oriented to person, place, and time.  Skin: Skin is warm and dry. No rash noted. She is not diaphoretic. No erythema.  Psychiatric: She has a normal mood and affect.  Vitals reviewed.    ED Treatments / Results  DIAGNOSTIC STUDIES: Oxygen Saturation is 99% on RA, normal by my interpretation.   COORDINATION OF CARE: 5:54 PM-Discussed next steps with pt including cardiac enzymes and ruling out a PE.Marland Kitchen Pt verbalized understanding and is agreeable with the plan.   Labs (all labs ordered are listed, but only abnormal results are  displayed) Labs Reviewed  COMPREHENSIVE METABOLIC PANEL - Abnormal; Notable for the following:       Result Value   Chloride 100 (*)    All other components within normal limits  CBC  LIPASE, BLOOD  TROPONIN I  D-DIMER, QUANTITATIVE (NOT AT [facility])    EKG  EKG Interpretation  Date/Time:  Saturday December 05 2016 15:54:07 EDT Ventricular Rate:  77 PR Interval:  124 QRS Duration: 86 QT Interval:  350 QTC Calculation: 396 R Axis:   49 Text Interpretation:  Normal sinus rhythm Normal ECG No significant change since last tracing Confirmed by Addison Lank [phone]) on 12/05/2016 3:55:45 PM       Radiology Dg Chest 2 View  Result Date: 12/05/2016 CLINICAL DATA:  Shortness of breath EXAM: CHEST  2 VIEW COMPARISON:  05/24/2015 FINDINGS: The heart size and mediastinal contours are within normal limits. Both lungs are clear. Aortic atherosclerosis. IMPRESSION: No active cardiopulmonary disease. Electronically Signed   By: Donavan Foil M.D.   On: 12/05/2016 18:37    Procedures Procedures (including critical care time)  Medications Ordered in ED Medications  aspirin chewable tablet 324 mg (324 mg Oral Given 12/05/16 1802)  gi cocktail (Maalox,Lidocaine,Donnatal) (30 mLs Oral Given 12/05/16 1804)     Initial Impression / Assessment and Plan / ED Course  I have reviewed the triage vital signs and the nursing notes.  Pertinent labs & imaging results that were available during my care of the patient were reviewed by me and considered in my medical decision making (see chart for details).     Atypical chest pain. EKG without acute ischemic changes or evidence of pericarditis. HEAR <4. Initial troponin negative. Given the fact that this is been ongoing for more than 24 hours, I feel that the single troponin should be sufficient to rule out ACS at this time.  Chest x-ray without evidence suggestive of pneumonia, pneumothorax, pneumomediastinum.  No abnormal contour of the mediastinum to  suggest dissection. No evidence of acute injuries.  Low pretest probability for pulmonary embolism but unable to [facility]. Dimer negative. Low suspicion for pulmonary embolism.  Presentation is classic for aortic dissection or esophageal perforation.  With regards to the patient's abdominal pain, abdomen was benign. Labs grossly reassuring without evidence of pancreatitis or biliary obstruction. Low suspicions for serious intra-abdominal inflammatory/infectious process.  The patient is safe for discharge with strict return precautions.   Final Clinical Impressions(s) / ED Diagnoses   Final diagnoses:  Chest pain  Shortness of breath  Epigastric pain   Disposition: Discharge  Condition: Good  I have discussed the results, Dx and Tx plan with the patient who expressed understanding and agree(s) with the plan. Discharge instructions discussed at great length. The patient was given strict return precautions who verbalized understanding of the instructions. No further questions at time of discharge.    New Prescriptions   No medications on file    Follow Up: [address], [place] [address] [phone]  Schedule an appointment as soon as possible for a visit  Within 30 days for stress testing   I personally performed the services described in this documentation, which was scribed in my presence. The recorded information has been reviewed and is accurate.        Fatima Blank, MD 12/05/16 2024

## 2016-12-05 NOTE — ED Triage Notes (Addendum)
Pt presents shortness of breath and nausea. Denies chest pain, vomiting. Pt able to speak in complete sentences. NAD at this time. RT at bedside for assessment.

## 2017-02-10 DIAGNOSIS — Z1283 Encounter for screening for malignant neoplasm of skin: Secondary | ICD-10-CM | POA: Diagnosis not present

## 2017-02-10 DIAGNOSIS — L821 Other seborrheic keratosis: Secondary | ICD-10-CM | POA: Diagnosis not present

## 2017-02-10 DIAGNOSIS — Z08 Encounter for follow-up examination after completed treatment for malignant neoplasm: Secondary | ICD-10-CM | POA: Diagnosis not present

## 2017-02-10 DIAGNOSIS — Z85820 Personal history of malignant melanoma of skin: Secondary | ICD-10-CM | POA: Diagnosis not present

## 2017-03-24 ENCOUNTER — Encounter: Payer: Self-pay | Admitting: Genetic Counselor

## 2017-03-24 ENCOUNTER — Other Ambulatory Visit: Payer: Medicare Other

## 2017-03-24 ENCOUNTER — Other Ambulatory Visit: Payer: Self-pay

## 2017-03-24 ENCOUNTER — Ambulatory Visit (HOSPITAL_BASED_OUTPATIENT_CLINIC_OR_DEPARTMENT_OTHER): Payer: Medicare Other | Admitting: Genetic Counselor

## 2017-03-24 DIAGNOSIS — Z803 Family history of malignant neoplasm of breast: Secondary | ICD-10-CM

## 2017-03-24 DIAGNOSIS — Z85820 Personal history of malignant melanoma of skin: Secondary | ICD-10-CM

## 2017-03-24 DIAGNOSIS — C4490 Unspecified malignant neoplasm of skin, unspecified: Secondary | ICD-10-CM

## 2017-03-24 DIAGNOSIS — Z800 Family history of malignant neoplasm of digestive organs: Secondary | ICD-10-CM | POA: Diagnosis not present

## 2017-03-24 DIAGNOSIS — Z8042 Family history of malignant neoplasm of prostate: Secondary | ICD-10-CM

## 2017-03-24 DIAGNOSIS — Z7183 Encounter for nonprocreative genetic counseling: Secondary | ICD-10-CM

## 2017-03-24 MED ORDER — NONFORMULARY OR COMPOUNDED ITEM: 2 refills | Status: DC

## 2017-03-24 NOTE — Progress Notes (Signed)
REFERRING PROVIDER: Orpah Melter, MD [address] [place], [place] [address]  PRIMARY PROVIDER:  Orpah Melter, MD  PRIMARY REASON FOR VISIT:  1. Personal history of malignant melanoma   2. Family history of breast cancer   3. Family history of colon cancer   4. Family history of prostate cancer   5. Family history of Lynch syndrome   6. Skin cancer      HISTORY OF PRESENT ILLNESS:   Ms. Yin, a 71 y.o. female, was seen for a [facility] cancer genetics consultation at the request of Dr. Olen Pel due to a personal and family history of cancer.  Ms. Westerfield presents to clinic today to discuss the possibility of a hereditary predisposition to cancer, genetic testing, and to further clarify her future cancer risks, as well as potential cancer risks for family members.   In February 2017, at the age of 82, Ms. Bolinger was diagnosed with melanoma on the forehead. This was treated with surgery.  She reports that she was not a "sun worshiper" when she was younger. She also reports that her half sister's daughter was diagnosed with Lynch syndrome.    CANCER HISTORY:   No history exists.     HORMONAL RISK FACTORS:  Menarche was at age 89.  First live birth at age 74.  OCP use for approximately 12 years.  Ovaries intact: yes.  Hysterectomy: yes.  Menopausal status: postmenopausal.  HRT use: 19 years. Colonoscopy: yes; 8-9 lifetime polyps. Mammogram within the last year: yes. Number of breast biopsies: 0. Up to date with pelvic exams:  no. Any excessive radiation exposure in the past:  no  Past Medical History:  Diagnosis Date  . Cancer ([place])    Melanoma  . Depression   . Family history of breast cancer   . Family history of colon cancer   . Family history of Lynch syndrome   . Family history of prostate cancer   . History of renal stone   . Hypercholesteremia   . Hypothyroid   . Lichen sclerosus   . Migraine   . Osteopenia 06/2016   T score -2.1 FRAX  12%/3.4%. Statistically significant decline at all measured sites  . Personal history of malignant melanoma   . Pneumonia   . Skin cancer 07/2015  . Tubular adenoma of colon 01/2013    Past Surgical History:  Procedure Laterality Date  . breast papilloma    . CESAREAN SECTION    . MELANOMA EXCISION WITH SENTINEL LYMPH NODE BIOPSY    . VAGINAL HYSTERECTOMY  1990    Social History   Social History  . Marital status: Married    Spouse name: Octavia Bruckner  . Number of children: 2  . Years of education: 12+   Occupational History  .  [employer]   Social History Main Topics  . Smoking status: Never Smoker  . Smokeless tobacco: Never Used  . Alcohol use 0.0 oz/week     Comment: occassionally  . Drug use: No  . Sexual activity: Not Currently    Birth control/ protection: Surgical     Comment: 1st intercourse 71 yo-Fewer than 5 partners   Other Topics Concern  . Not on file   Social History Narrative   Patient lives at home with Husband [name]    Patient has 2 children.    Patient works at [employer].    Patient has 2 years of college.    Patient is right handed  FAMILY HISTORY:  We obtained a detailed, 4-generation family history.  Significant diagnoses are listed below: Family History  Problem Relation Age of Onset  . Hypertension Mother   . Colon cancer Mother        mets from breast cancer  . Heart disease Mother   . Breast cancer Mother        Age 21, mets all over   . Heart disease Brother   . Diabetes Maternal Grandmother   . Heart disease Maternal Grandfather   . Breast cancer Maternal Aunt 28  . Breast cancer Paternal Aunt        Age 7  . Breast cancer Maternal Aunt        Age 49  . Breast cancer Sister 5       maternal half sister  . Breast cancer Other        Lynch syndrome  . Prostate cancer Cousin        maternal first cousin  . Colon cancer Cousin        maternal first cousin  . Cancer Cousin        NOS - maternal first cousin  .  Breast cancer Cousin 70       TN breast cancer, maternal first cousin    The patient has two sons who are cancer free.  She has one sister and two brothers who are cancer free.  The patient also had one maternal half sister.  This sister developed breast cancer at 55 and died of liver cirrhosis at 73. This sister had five children, one who had breast cancer at 93.  This niece had genetic testing and was found to have Lynch syndrome.  The report is not available today. Both parents are deceased.    The patient's mother was diagnosed with breast cancer at age 47.  This eventually became metastatic and spread to her colon at age 3.  She had two brothers and two sisters.  One brother did not have cancer but had three children with cancer - a son with prostate cancer, a son with colon cancer and a daughter with an unknown cancer.  One sister had breast cancer at 43 and died at 32.  This sister had one daughter who has TN breast cancer at age 32.  She had genetic testing that found a VUS but is otherwise negative.  The maternal grandparents are deceased from non cancer related issues.  The patient's father died of old age at 11.  He was one of 13 children.  He had one sister with breast cancer.  There is no other reported family history of cancer on that side of the family.  Patient's maternal ancestors are of [place] descent, and paternal ancestors are of Northern European descent. There is no reported Ashkenazi Jewish ancestry. There is no known consanguinity.  GENETIC COUNSELING ASSESSMENT: GEORGE ALCANTAR is a 71 y.o. female with a personal history of melanoma, family history of cancer and known family mutation in a Lynch syndrome gene which is somewhat suggestive of Lynch syndrome and predisposition to cancer. We, therefore, discussed and recommended the following at today's visit.   DISCUSSION: We discussed that about 5-10% of breast cancer is hereditary with most cases due to BRCA  mutations.  About 5-6% of colon cancer is hereditary with most cases due to Lynch syndrome.  In some cases, breast cancer can also be associated with Lynch syndrome.  The patient's niece was diagnosed with Lynch syndrome.  This niece is the patient's maternal half sister's daughter.  Therefore the patient has a 12.5% chance of also having Lynch syndrome.  We reviewed the characteristics, features and inheritance patterns of hereditary cancer syndromes. We also discussed genetic testing, including the appropriate family members to test, the process of testing, insurance coverage and turn-around-time for results. We discussed the implications of a negative, positive and/or variant of uncertain significant result. We recommended Ms. Stephens November pursue genetic testing for the [facility] panel. The Multi-Gene Panel offered by [facility] includes sequencing and/or deletion duplication testing of the following 83 genes: ALK, APC, ATM, AXIN2,BAP1,  BARD1, BLM, BMPR1A, BRCA1, BRCA2, BRIP1, CASR, CDC73, CDH1, CDK4, CDKN1B, CDKN1C, CDKN2A (p14ARF), CDKN2A (p16INK4a), CEBPA, CHEK2, CTNNA1, DICER1, DIS3L2, EGFR (c.2369C>T, p.Thr790Met variant only), EPCAM (Deletion/duplication testing only), FH, FLCN, GATA2, GPC3, GREM1 (Promoter region deletion/duplication testing only), HOXB13 (c.251G>A, p.Gly84Glu), HRAS, KIT, MAX, MEN1, MET, MITF (c.952G>A, p.Glu318Lys variant only), MLH1, MSH2, MSH3, MSH6, MUTYH, NBN, NF1, NF2, NTHL1, PALB2, PDGFRA, PHOX2B, PMS2, POLD1, POLE, POT1, PRKAR1A, PTCH1, PTEN, RAD50, RAD51C, RAD51D, RB1, RECQL4, RET, RUNX1, SDHAF2, SDHA (sequence changes only), SDHB, SDHC, SDHD, SMAD4, SMARCA4, SMARCB1, SMARCE1, STK11, SUFU, TERT, TERT, TMEM127, TP53, TSC1, TSC2, VHL, WRN and WT1.    Based on Ms. Lafave's personal and family history of cancer, she meets medical criteria for genetic testing. Despite that she meets criteria, she may still have an out of pocket cost. We discussed that if her out of pocket cost for  testing is over $100, the laboratory will call and confirm whether she wants to proceed with testing.  If the out of pocket cost of testing is less than $100 she will be billed by the genetic testing laboratory.   Based on the patient's personal and family history, statistical models Designer, fashion/clothing)  and literature data were used to estimate her risk of developing breast cancer. These estimate her lifetime risk of developing breast cancer to be approximately 13%. This estimation does not take into account any genetic testing results.  The patient's lifetime breast cancer risk is a preliminary estimate based on available information using one of several models endorsed by the [place] (ACS). The ACS recommends consideration of breast MRI screening as an adjunct to mammography for patients at high risk (defined as 20% or greater lifetime risk). A more detailed breast cancer risk assessment can be considered, if clinically indicated.     PLAN: After considering the risks, benefits, and limitations, Ms. Dargis  provided informed consent to pursue genetic testing and the blood sample was sent to [facility] for analysis of the Multi-Gene Cancer Panel. Results should be available within approximately 2-3 weeks' time, at which point they will be disclosed by telephone to Ms. Odonell, as will any additional recommendations warranted by these results. Ms. Cleckley will receive a summary of her genetic counseling visit and a copy of her results once available. This information will also be available in Epic. We encouraged Ms. Fales to remain in contact with cancer genetics annually so that we can continuously update the family history and inform her of any changes in cancer genetics and testing that may be of benefit for her family. Ms. Coulson questions were answered to her satisfaction today. Our contact information was provided should additional questions or concerns arise.  Lastly,  we encouraged Ms. Barrientes to remain in contact with cancer genetics annually so that we can continuously update the family history and inform her of any changes in cancer genetics and testing that may  be of benefit for this family.   Ms.  Roehrs questions were answered to her satisfaction today. Our contact information was provided should additional questions or concerns arise. Thank you for the referral and allowing [place] to share in the care of your patient.   Rohit Deloria P. Florene Glen, [employer], [facility] Certified Genetic Counselor [email or web address] phone: [phone]  The patient was seen for a total of 60 minutes in face-to-face genetic counseling.  This patient was discussed with Drs. Magrinat, Lindi Adie and/or Burr Medico who agrees with the above.    _______________________________________________________________________ For Office Staff:  Number of people involved in session: 2 Was an Intern/ student involved with case: no

## 2017-04-09 ENCOUNTER — Encounter: Payer: Self-pay | Admitting: Genetic Counselor

## 2017-04-09 ENCOUNTER — Telehealth: Payer: Self-pay | Admitting: Genetic Counselor

## 2017-04-09 ENCOUNTER — Ambulatory Visit: Payer: Self-pay | Admitting: Genetic Counselor

## 2017-04-09 DIAGNOSIS — Z8042 Family history of malignant neoplasm of prostate: Secondary | ICD-10-CM

## 2017-04-09 DIAGNOSIS — Z803 Family history of malignant neoplasm of breast: Secondary | ICD-10-CM

## 2017-04-09 DIAGNOSIS — Z1379 Encounter for other screening for genetic and chromosomal anomalies: Secondary | ICD-10-CM | POA: Insufficient documentation

## 2017-04-09 DIAGNOSIS — Z800 Family history of malignant neoplasm of digestive organs: Secondary | ICD-10-CM

## 2017-04-09 DIAGNOSIS — Z85820 Personal history of malignant melanoma of skin: Secondary | ICD-10-CM

## 2017-04-09 NOTE — Telephone Encounter (Signed)
Revealed negative genetic testing.  She does not have the AXIN2 VUS or Lynch syndrome that was identified in her family.  Released a copy of her testing to her.  She was tested for melanoma genes and she was negative for this.  Discussed following up with her dermatologist regarding future risks for melanoma.

## 2017-04-09 NOTE — Progress Notes (Addendum)
HPI: Carla Alvarado was previously seen in the [place] clinic due to a personal and family history of cancer and concerns regarding a hereditary predisposition to cancer. Please refer to our prior cancer genetics clinic note for more information regarding Carla Alvarado's medical, social and family histories, and our assessment and recommendations, at the time. Carla Alvarado recent genetic test results were disclosed to her, as were recommendations warranted by these results. These results and recommendations are discussed in more detail below.  CANCER HISTORY:   No history exists.    FAMILY HISTORY:  We obtained a detailed, 4-generation family history.  Significant diagnoses are listed below: Family History  Problem Relation Age of Onset  . Hypertension Mother   . Colon cancer Mother        mets from breast cancer  . Heart disease Mother   . Breast cancer Mother        Age 8, mets all over   . Heart disease Brother   . Diabetes Maternal Grandmother   . Heart disease Maternal Grandfather   . Breast cancer Maternal Aunt 62  . Breast cancer Paternal Aunt        Age 59  . Breast cancer Maternal Aunt        Age 90  . Breast cancer Sister 48       maternal half sister  . Breast cancer Other        Lynch syndrome  . Prostate cancer Cousin        maternal first cousin  . Colon cancer Cousin        maternal first cousin  . Cancer Cousin        NOS - maternal first cousin  . Breast cancer Cousin 34       TN breast cancer, maternal first cousin    The patient has two sons who are cancer free. She has one sister and two brothers who are cancer free.  The patient also had one maternal half sister. This sister developed breast cancer at 23 and died of liver cirrhosis at 39. This sister had five children, one who had breast cancer at 36. This niece had genetic testing and was found to have Lynch syndrome. The report is not available today. Both parents are deceased.    The patient's mother was diagnosed with breast cancer at age 30. This eventually became metastatic and spread to her colon at age 44. She had two brothers and two sisters. One brother did not have cancer but had three children with cancer - a son with prostate cancer, a son with colon cancer and a daughter with an unknown cancer. One sister had breast cancer at 32 and died at 66. This sister had one daughter who has [place] breast cancer at age 45. She had genetic testing that found a VUS but is otherwise negative. The maternal grandparents are deceased from non cancer related issues.  The patient's father died of old age at 22. He was one of 13 children. He had one sister with breast cancer. There is no other reported family history of cancer on that side of the family.  Patient's maternal ancestors are of [employer], and paternal ancestors are of [employer]. There is noreported Ashkenazi Jewish ancestry. There is noknown consanguinity.  GENETIC TEST RESULTS: Genetic testing reported out on April 08, 2017 through the Multi-gene cancer panel found no deleterious mutations.  The Multi-Gene Panel offered by [facility] includes sequencing and/or deletion duplication testing  of the following 80 genes: ALK, APC, ATM, AXIN2,BAP1,  BARD1, BLM, BMPR1A, BRCA1, BRCA2, BRIP1, CASR, CDC73, CDH1, CDK4, CDKN1B, CDKN1C, CDKN2A (p14ARF), CDKN2A (p16INK4a), CEBPA, CHEK2, CTNNA1, DICER1, DIS3L2, EGFR (c.2369C>T, p.Thr790Met variant only), EPCAM (Deletion/duplication testing only), FH, FLCN, GATA2, GPC3, GREM1 (Promoter region deletion/duplication testing only), HOXB13 (c.251G>A, p.Gly84Glu), HRAS, KIT, MAX, MEN1, MET, MITF (c.952G>A, p.Glu318Lys variant only), MLH1, MSH2, MSH3, MSH6, MUTYH, NBN, NF1, NF2, NTHL1, PALB2, PDGFRA, PHOX2B, PMS2, POLD1, POLE, POT1, PRKAR1A, PTCH1, PTEN, RAD50, RAD51C, RAD51D, RB1, RECQL4, RET, RUNX1, SDHAF2, SDHA (sequence changes only), SDHB, SDHC, SDHD,  SMAD4, SMARCA4, SMARCB1, SMARCE1, STK11, SUFU, TERT, TERT, TMEM127, TP53, TSC1, TSC2, VHL, WRN and WT1.  The test report has been scanned into EPIC and is located under the Molecular Pathology section of the Results Review tab.   We discussed with Carla Alvarado that since the current genetic testing is not perfect, it is possible there may be a gene mutation in one of these genes that current testing cannot detect, but that chance is small. We also discussed, that it is possible that another gene that has not yet been discovered, or that we have not yet tested, is responsible for the cancer diagnoses in the family, and it is, therefore, important to remain in touch with cancer genetics in the future so that we can continue to offer Carla Alvarado the most up to date genetic testing.     CANCER SCREENING RECOMMENDATIONS:  This normal result is reassuring and indicates that Carla Alvarado does not likely have an increased risk of cancer due to a mutation in one of these genes.  Her niece was diagnosed with Lynch syndrome, which was NOT found with Carla Alvarado's genetic testing.  We, therefore, recommended  Carla Alvarado continue to follow the cancer screening guidelines provided by her primary healthcare providers.   RECOMMENDATIONS FOR FAMILY MEMBERS: Women in this family might be at some increased risk of developing cancer, over the general population risk, simply due to the family history of cancer. We recommended women in this family have a yearly mammogram beginning at age 20, or 39 years younger than the earliest onset of cancer, an annual clinical breast exam, and perform monthly breast self-exams. Women in this family should also have a gynecological exam as recommended by their primary provider. All family members should have a colonoscopy by age 68.  Based on the patient's personal and family history, statistical models Designer, fashion/clothing)  and literature data were used to estimate her risk of developing  breast cancer. These estimate her lifetime risk of developing breast cancer to be approximately 12.7%. This estimation does not take into account any genetic testing results.  The patient's lifetime breast cancer risk is a preliminary estimate based on available information using one of several models endorsed by the [place] (ACS). The ACS recommends consideration of breast MRI screening as an adjunct to mammography for patients at high risk (defined as 20% or greater lifetime risk). A more detailed breast cancer risk assessment can be considered, if clinically indicated.     FOLLOW-UP: Lastly, we discussed with Carla Alvarado that cancer genetics is a rapidly advancing field and it is possible that new genetic tests will be appropriate for her and/or her family members in the future. We encouraged her to remain in contact with cancer genetics on an annual basis so we can update her personal and family histories and let her know of advances in cancer genetics that may benefit this family.   Our  contact number was provided. Carla Alvarado questions were answered to her satisfaction, and she knows she is welcome to call us at anytime with additional questions or concerns.   Roma Kayser, MS, [facility] Certified Genetic Counselor [email or web address]

## 2017-05-16 DIAGNOSIS — B349 Viral infection, unspecified: Secondary | ICD-10-CM | POA: Diagnosis not present

## 2017-05-24 ENCOUNTER — Other Ambulatory Visit: Payer: Self-pay | Admitting: Gynecology

## 2017-05-24 DIAGNOSIS — Z1231 Encounter for screening mammogram for malignant neoplasm of breast: Secondary | ICD-10-CM

## 2017-05-25 DIAGNOSIS — J0100 Acute maxillary sinusitis, unspecified: Secondary | ICD-10-CM | POA: Diagnosis not present

## 2017-06-03 DIAGNOSIS — M545 Low back pain: Secondary | ICD-10-CM | POA: Diagnosis not present

## 2017-06-08 IMAGING — MG 2D DIGITAL SCREENING BILATERAL MAMMOGRAM WITH CAD AND ADJUNCT TO
9 of 12 series · 9 of 28 positions shown · non-contrast
Comparison: Previous exam(s).

CLINICAL DATA: Screening.

EXAM:
2D DIGITAL SCREENING BILATERAL MAMMOGRAM WITH CAD AND ADJUNCT TOMO

[L CC synth-2D]
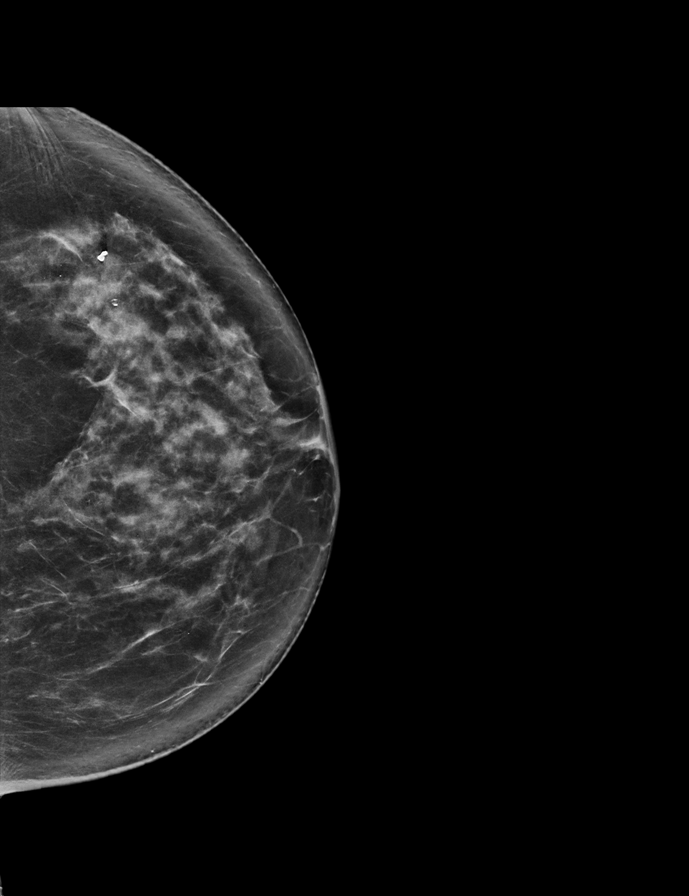

[R CC]
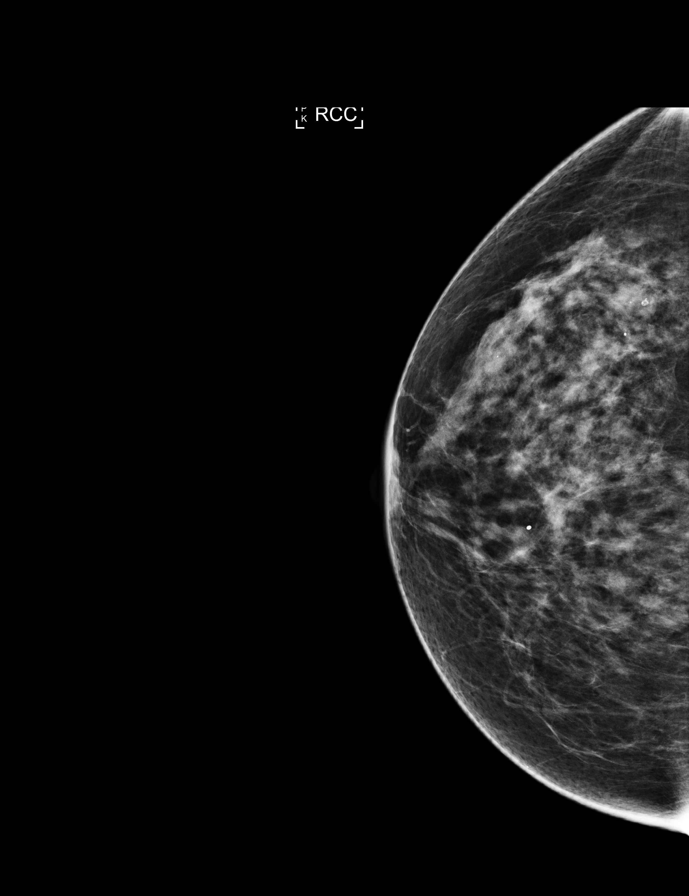

[R CC synth-2D]
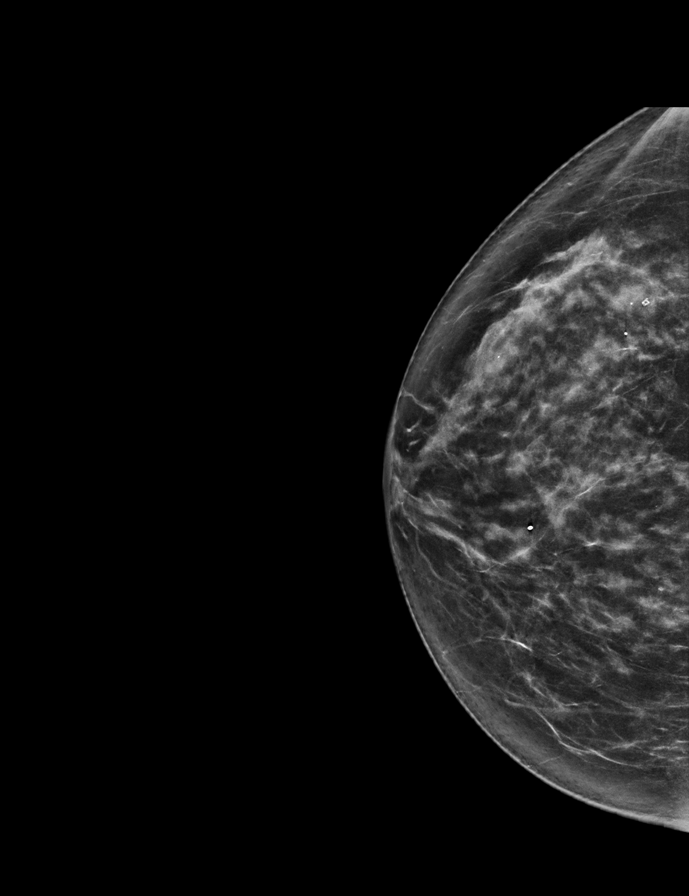

[L MLO]
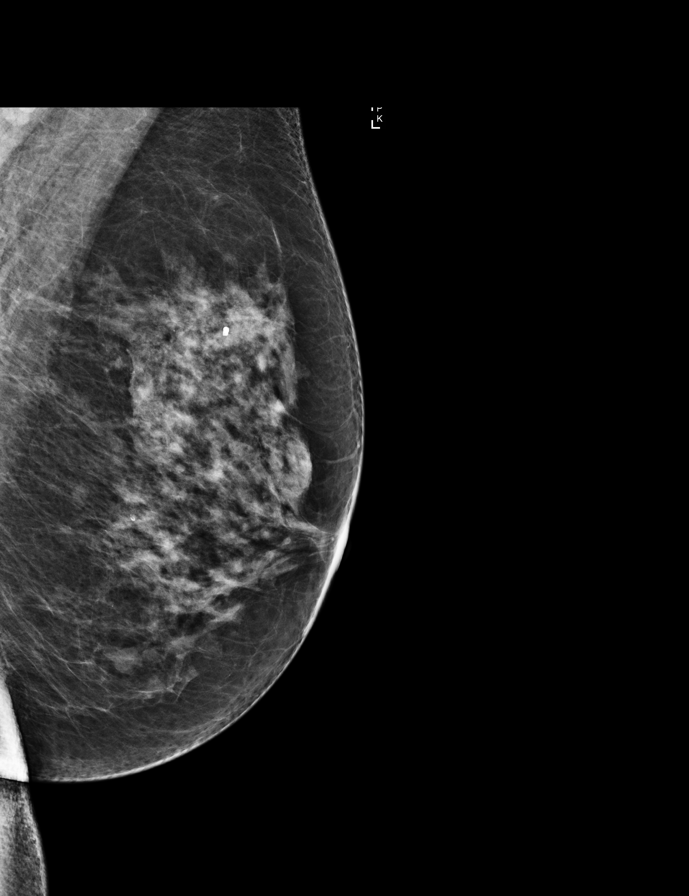

[R MLO synth-2D]
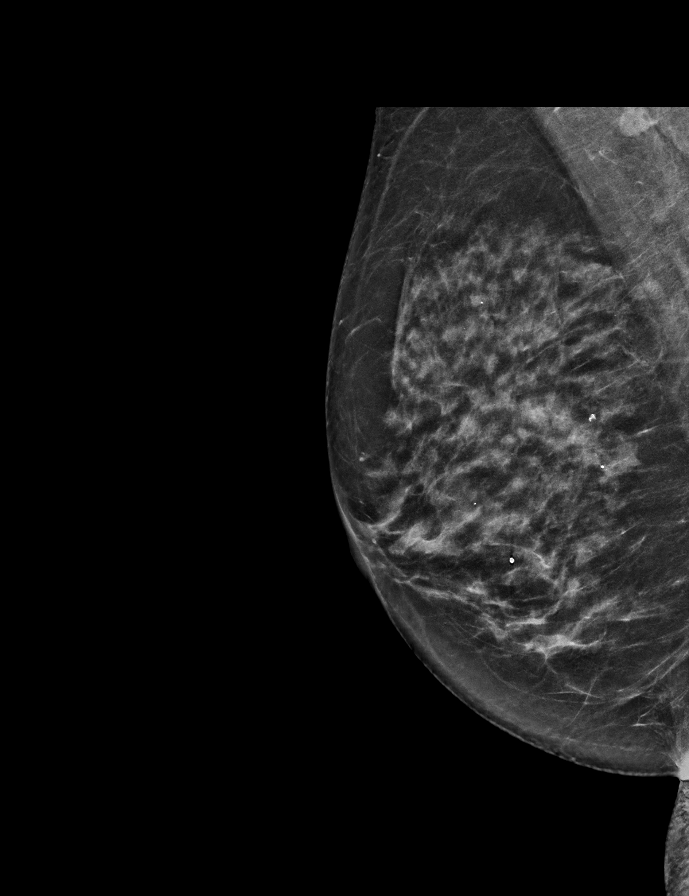

[L CC]
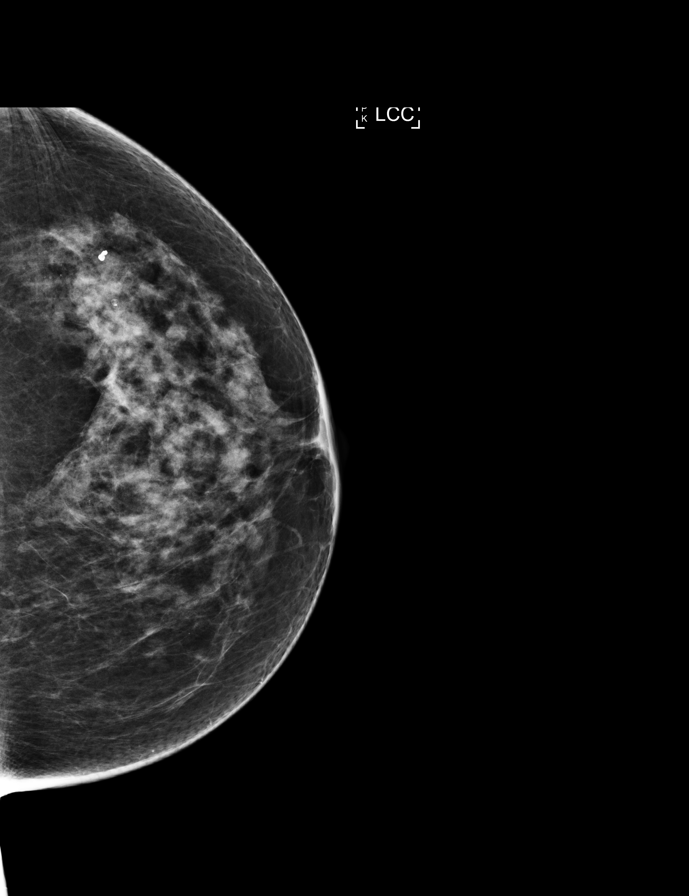

[L MLO synth-2D]
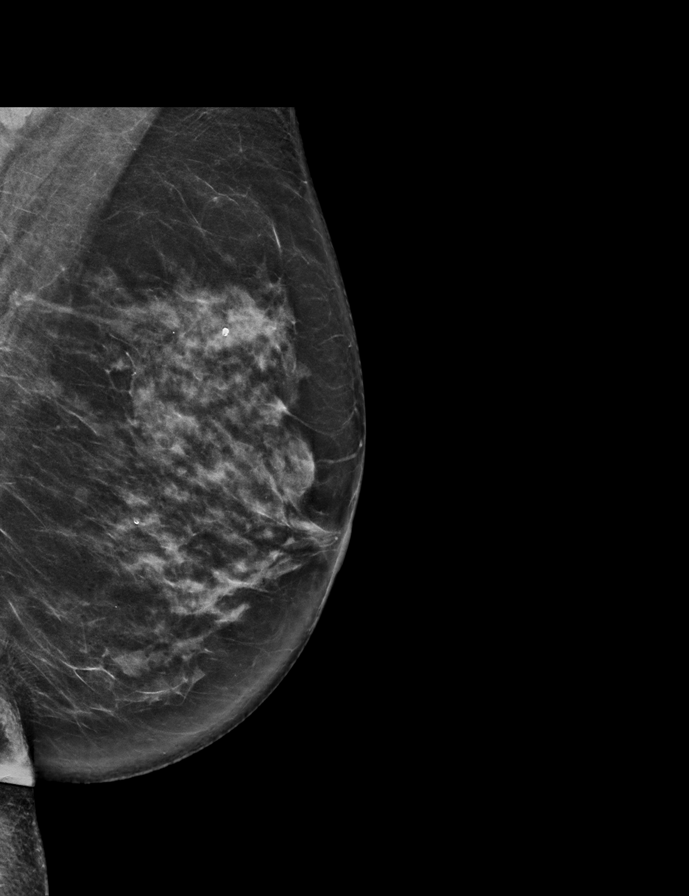

[R MLO]
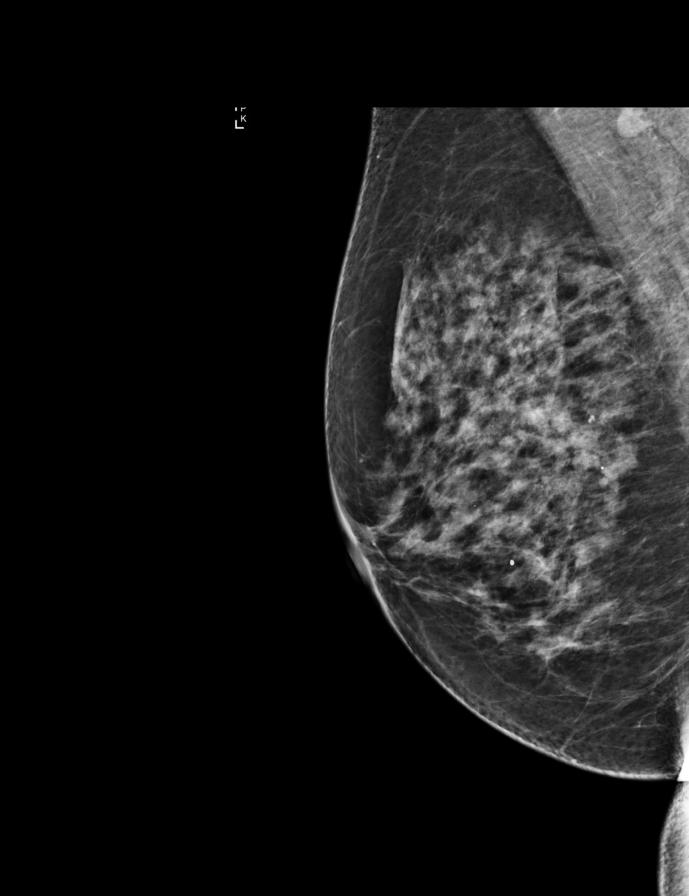

[R MLO tomo · tomo slice 35/68.0]
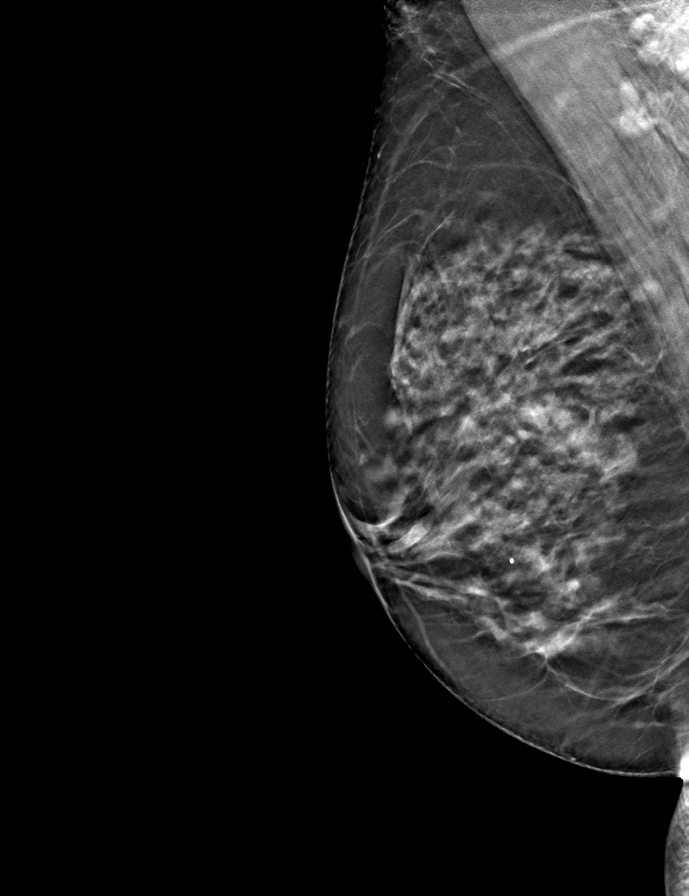

[9 of 28 positions shown; findings below may reference images not displayed]

ACR Breast Density Category c: The breast tissue is heterogeneously
dense, which may obscure small masses.
FINDINGS: There are no findings suspicious for malignancy. Images were
processed with CAD.
IMPRESSION: No mammographic evidence of malignancy. A result letter of this
screening mammogram will be mailed directly to the patient.

RECOMMENDATION:
Screening mammogram in one year. (Code:TN-[id number])

BI-RADS CATEGORY  1: Negative.

## 2017-06-28 ENCOUNTER — Ambulatory Visit
Admission: RE | Admit: 2017-06-28 | Discharge: 2017-06-28 | Disposition: A | Discharge status: Home / Self Care | Payer: Medicare HMO | Source: Ambulatory Visit | Attending: Gynecology | Admitting: Gynecology

## 2017-06-28 DIAGNOSIS — Z1231 Encounter for screening mammogram for malignant neoplasm of breast: Secondary | ICD-10-CM | POA: Diagnosis not present

## 2017-07-02 ENCOUNTER — Encounter: Payer: Self-pay | Admitting: Gynecology

## 2017-07-02 ENCOUNTER — Ambulatory Visit: Payer: Medicare HMO | Admitting: Gynecology

## 2017-07-02 VITALS — BP 120/76 | Ht 59.0 in | Wt 108.0 lb

## 2017-07-02 DIAGNOSIS — Z1272 Encounter for screening for malignant neoplasm of vagina: Secondary | ICD-10-CM | POA: Diagnosis not present

## 2017-07-02 DIAGNOSIS — M8588 Other specified disorders of bone density and structure, other site: Secondary | ICD-10-CM

## 2017-07-02 DIAGNOSIS — M8580 Other specified disorders of bone density and structure, unspecified site: Secondary | ICD-10-CM

## 2017-07-02 DIAGNOSIS — L900 Lichen sclerosus et atrophicus: Secondary | ICD-10-CM | POA: Diagnosis not present

## 2017-07-02 DIAGNOSIS — N952 Postmenopausal atrophic vaginitis: Secondary | ICD-10-CM

## 2017-07-02 DIAGNOSIS — N898 Other specified noninflammatory disorders of vagina: Secondary | ICD-10-CM | POA: Diagnosis not present

## 2017-07-02 DIAGNOSIS — Z01411 Encounter for gynecological examination (general) (routine) with abnormal findings: Secondary | ICD-10-CM | POA: Diagnosis not present

## 2017-07-02 LAB — WET PREP FOR TRICH, YEAST, CLUE

## 2017-07-02 NOTE — Addendum Note (Signed)
Addended by: Nelva Nay on: 07/02/2017 09:31 AM   Modules accepted: Orders

## 2017-07-02 NOTE — Progress Notes (Signed)
MCKENZI BUONOMO [birth year] [id number]        71 y.o.  [id number] for breast and pelvic exam.  Patient complaining of vaginal irritation and itching.  No discharge or odor.  No urinary symptoms such as frequency dysuria urgency low back pain.  Has been going on over the last several weeks to month.  She does have a history of lichen sclerosis but has not used her clobetasol.  Also has a history of atrophic changes and was on estradiol vaginal cream and is using it now once weekly.  Status post TVH for adenomyosis in 1990  Past medical history,surgical history, problem list, medications, allergies, family history and social history were all reviewed and documented as reviewed in the EPIC chart.  ROS:  Performed with pertinent positives and negatives included in the history, assessment and plan.   Additional significant findings : None   Exam: [name] assistant Vitals:   07/02/17 0841  BP: 120/76  Weight: 108 lb (49 kg)  Height: 4\' 11"  (1.499 m)   Body mass index is 21.81 kg/m.  General appearance:  Normal affect, orientation and appearance. Skin: Grossly normal HEENT: Without gross lesions.  No cervical or supraclavicular adenopathy. Thyroid normal.  Lungs:  Clear without wheezing, rales or rhonchi Cardiac: RR, without RMG Abdominal:  Soft, nontender, without masses, guarding, rebound, organomegaly or hernia Breasts:  Examined lying and sitting without masses, retractions, discharge or axillary adenopathy. Pelvic:  Ext, BUS, Vagina: With atrophic changes.  Scant discharge symmetrical blanching of the skin from the periclitoral region to the posterior fourchette bilaterally consistent with lichen sclerosis.  Pap smear of vaginal cuff done  Adnexa: Without masses or tenderness    Anus and perineum: Normal   Rectovaginal: Normal sphincter tone without palpated masses or tenderness.    Assessment/Plan:  72 y.o. [id number] female for breast and pelvic exam.  Status post TVH  1990  1. Vaginal irritation.  Wet prep was negative for yeast or bacterial vaginosis.  I suspect her symptoms are a combination of atrophic vaginitis that she is using her estrogen only once weekly as well as lichen sclerosis as she is not using her clobetasol at all.  I recommended she use the clobetasol nightly for 4 weeks and then taper from that.  To increase her estradiol vaginal cream twice weekly and to stay on this.  She will follow-up if her symptoms continue. 2. Osteopenia.  06/2016 T score -2.1 FRAX 12% / 3.4%.  We had a consult discussing this 07/28/2016 and the patient decided not to treat with medication but with increasing exercise and to repeat her bone density out of 2-year interval.  Vitamin D level this year was 45. 3. Pap smear 05/2014.  Pap smear done today.  No history of significant abnormal Pap smears.  Reviewed current screening guidelines and at this point we both agreed this will be the last Pap smear and she is comfortable with this. 4. Mammography last week.  Breast exam normal today.  SBE monthly reviewed.  Continue with annual mammography.  Strong family history of breast cancer.  Recently underwent genetic screening and was negative. 5. Colonoscopy 2016.  Repeat at their recommended interval. 6. Health maintenance.  No routine lab work done as patient does this elsewhere.  Follow-up in 1 year, sooner as needed.  Additional time in excess of her breast and pelvic exam was spent in direct face to face counseling and coordination of care in regards to her vaginitis management.  [name], 9:08 AM 07/02/2017

## 2017-07-02 NOTE — Patient Instructions (Signed)
Use the vaginal estrogen cream twice weekly and stay on it.  Use the clobetasol cream nightly for 3-4 weeks then every other night for 2 weeks and then stop.  Start it any time you start to feel vaginal irritation and use it for 1-2 weeks continuously until irritation is gone.

## 2017-07-06 LAB — PAP IG W/ RFLX HPV ASCU

## 2017-07-28 ENCOUNTER — Telehealth: Payer: Self-pay | Admitting: Genetic Counselor

## 2017-07-28 NOTE — Telephone Encounter (Signed)
Mailbox is full. Cannot leave message.

## 2017-10-13 DIAGNOSIS — H57811 Brow ptosis, right: Secondary | ICD-10-CM | POA: Diagnosis not present

## 2017-10-13 DIAGNOSIS — H57819 Brow ptosis, unspecified: Secondary | ICD-10-CM | POA: Diagnosis not present

## 2017-10-13 DIAGNOSIS — Z6822 Body mass index (BMI) 22.0-22.9, adult: Secondary | ICD-10-CM | POA: Diagnosis not present

## 2017-10-13 DIAGNOSIS — C4339 Malignant melanoma of other parts of face: Secondary | ICD-10-CM | POA: Diagnosis not present

## 2017-10-15 DIAGNOSIS — Z131 Encounter for screening for diabetes mellitus: Secondary | ICD-10-CM | POA: Diagnosis not present

## 2017-10-15 DIAGNOSIS — M8580 Other specified disorders of bone density and structure, unspecified site: Secondary | ICD-10-CM | POA: Diagnosis not present

## 2017-10-15 DIAGNOSIS — K219 Gastro-esophageal reflux disease without esophagitis: Secondary | ICD-10-CM | POA: Diagnosis not present

## 2017-10-15 DIAGNOSIS — Z0000 Encounter for general adult medical examination without abnormal findings: Secondary | ICD-10-CM | POA: Diagnosis not present

## 2017-10-15 DIAGNOSIS — R69 Illness, unspecified: Secondary | ICD-10-CM | POA: Diagnosis not present

## 2017-10-15 DIAGNOSIS — E559 Vitamin D deficiency, unspecified: Secondary | ICD-10-CM | POA: Diagnosis not present

## 2017-10-15 DIAGNOSIS — E039 Hypothyroidism, unspecified: Secondary | ICD-10-CM | POA: Diagnosis not present

## 2017-10-15 DIAGNOSIS — G4700 Insomnia, unspecified: Secondary | ICD-10-CM | POA: Diagnosis not present

## 2017-10-15 DIAGNOSIS — E7800 Pure hypercholesterolemia, unspecified: Secondary | ICD-10-CM | POA: Diagnosis not present

## 2017-10-15 DIAGNOSIS — G43909 Migraine, unspecified, not intractable, without status migrainosus: Secondary | ICD-10-CM | POA: Diagnosis not present

## 2017-12-13 ENCOUNTER — Encounter: Payer: Self-pay | Admitting: Gastroenterology

## 2017-12-22 ENCOUNTER — Other Ambulatory Visit: Payer: Self-pay

## 2017-12-22 MED ORDER — NONFORMULARY OR COMPOUNDED ITEM: 3 refills | Status: DC

## 2018-01-20 DIAGNOSIS — M5136 Other intervertebral disc degeneration, lumbar region: Secondary | ICD-10-CM | POA: Diagnosis not present

## 2018-01-20 DIAGNOSIS — M5030 Other cervical disc degeneration, unspecified cervical region: Secondary | ICD-10-CM | POA: Diagnosis not present

## 2018-02-02 DIAGNOSIS — M5030 Other cervical disc degeneration, unspecified cervical region: Secondary | ICD-10-CM | POA: Diagnosis not present

## 2018-02-07 DIAGNOSIS — M5030 Other cervical disc degeneration, unspecified cervical region: Secondary | ICD-10-CM | POA: Diagnosis not present

## 2018-02-09 DIAGNOSIS — M5030 Other cervical disc degeneration, unspecified cervical region: Secondary | ICD-10-CM | POA: Diagnosis not present

## 2018-02-14 DIAGNOSIS — M5030 Other cervical disc degeneration, unspecified cervical region: Secondary | ICD-10-CM | POA: Diagnosis not present

## 2018-02-18 DIAGNOSIS — M5030 Other cervical disc degeneration, unspecified cervical region: Secondary | ICD-10-CM | POA: Diagnosis not present

## 2018-02-22 DIAGNOSIS — M5030 Other cervical disc degeneration, unspecified cervical region: Secondary | ICD-10-CM | POA: Diagnosis not present

## 2018-02-24 DIAGNOSIS — M5030 Other cervical disc degeneration, unspecified cervical region: Secondary | ICD-10-CM | POA: Diagnosis not present

## 2018-03-04 DIAGNOSIS — M5030 Other cervical disc degeneration, unspecified cervical region: Secondary | ICD-10-CM | POA: Diagnosis not present

## 2018-03-05 DIAGNOSIS — M1711 Unilateral primary osteoarthritis, right knee: Secondary | ICD-10-CM | POA: Diagnosis not present

## 2018-03-05 DIAGNOSIS — M5136 Other intervertebral disc degeneration, lumbar region: Secondary | ICD-10-CM | POA: Diagnosis not present

## 2018-03-05 DIAGNOSIS — M2550 Pain in unspecified joint: Secondary | ICD-10-CM | POA: Diagnosis not present

## 2018-03-05 DIAGNOSIS — M5030 Other cervical disc degeneration, unspecified cervical region: Secondary | ICD-10-CM | POA: Diagnosis not present

## 2018-03-08 DIAGNOSIS — M545 Low back pain: Secondary | ICD-10-CM | POA: Diagnosis not present

## 2018-03-08 DIAGNOSIS — M150 Primary generalized (osteo)arthritis: Secondary | ICD-10-CM | POA: Diagnosis not present

## 2018-03-08 DIAGNOSIS — M5410 Radiculopathy, site unspecified: Secondary | ICD-10-CM | POA: Diagnosis not present

## 2018-03-08 DIAGNOSIS — M25561 Pain in right knee: Secondary | ICD-10-CM | POA: Diagnosis not present

## 2018-03-08 DIAGNOSIS — Z6821 Body mass index (BMI) 21.0-21.9, adult: Secondary | ICD-10-CM | POA: Diagnosis not present

## 2018-03-15 ENCOUNTER — Encounter: Payer: Self-pay | Admitting: Gastroenterology

## 2018-03-17 DIAGNOSIS — M5136 Other intervertebral disc degeneration, lumbar region: Secondary | ICD-10-CM | POA: Diagnosis not present

## 2018-03-17 DIAGNOSIS — M25561 Pain in right knee: Secondary | ICD-10-CM | POA: Diagnosis not present

## 2018-03-17 DIAGNOSIS — M25562 Pain in left knee: Secondary | ICD-10-CM | POA: Diagnosis not present

## 2018-03-17 DIAGNOSIS — M1711 Unilateral primary osteoarthritis, right knee: Secondary | ICD-10-CM | POA: Diagnosis not present

## 2018-03-30 DIAGNOSIS — M2550 Pain in unspecified joint: Secondary | ICD-10-CM | POA: Diagnosis not present

## 2018-04-06 ENCOUNTER — Other Ambulatory Visit: Payer: Self-pay | Admitting: Gynecology

## 2018-04-06 DIAGNOSIS — Z1231 Encounter for screening mammogram for malignant neoplasm of breast: Secondary | ICD-10-CM

## 2018-04-06 DIAGNOSIS — M2550 Pain in unspecified joint: Secondary | ICD-10-CM | POA: Diagnosis not present

## 2018-04-12 ENCOUNTER — Encounter: Payer: Self-pay | Admitting: Gastroenterology

## 2018-04-12 ENCOUNTER — Ambulatory Visit (AMBULATORY_SURGERY_CENTER): Payer: Self-pay

## 2018-04-12 VITALS — Ht 59.0 in | Wt 105.2 lb

## 2018-04-12 DIAGNOSIS — Z86010 Personal history of colonic polyps: Secondary | ICD-10-CM

## 2018-04-12 MED ORDER — NA SULFATE-K SULFATE-MG SULF 17.5-3.13-1.6 GM/177ML PO SOLN: 1.0000 | Freq: Once | ORAL | 0 refills | Status: AC

## 2018-04-12 NOTE — Progress Notes (Signed)
Denies allergies to eggs or soy products. Denies complication of anesthesia or sedation. Denies use of weight loss medication. Denies use of O2.   Emmi instructions declined.  

## 2018-04-15 DIAGNOSIS — M2550 Pain in unspecified joint: Secondary | ICD-10-CM | POA: Diagnosis not present

## 2018-04-19 DIAGNOSIS — M2550 Pain in unspecified joint: Secondary | ICD-10-CM | POA: Diagnosis not present

## 2018-04-25 ENCOUNTER — Encounter: Payer: Self-pay | Admitting: Gastroenterology

## 2018-04-25 ENCOUNTER — Ambulatory Visit (AMBULATORY_SURGERY_CENTER): Payer: Medicare HMO | Admitting: Gastroenterology

## 2018-04-25 VITALS — BP 116/61 | HR 67 | Temp 98.4°F | Resp 15 | Ht 59.0 in | Wt 108.0 lb

## 2018-04-25 DIAGNOSIS — E039 Hypothyroidism, unspecified: Secondary | ICD-10-CM | POA: Diagnosis not present

## 2018-04-25 DIAGNOSIS — D122 Benign neoplasm of ascending colon: Secondary | ICD-10-CM | POA: Diagnosis not present

## 2018-04-25 DIAGNOSIS — Z86010 Personal history of colonic polyps: Secondary | ICD-10-CM | POA: Diagnosis not present

## 2018-04-25 DIAGNOSIS — Z800 Family history of malignant neoplasm of digestive organs: Secondary | ICD-10-CM

## 2018-04-25 DIAGNOSIS — E785 Hyperlipidemia, unspecified: Secondary | ICD-10-CM | POA: Diagnosis not present

## 2018-04-25 MED ORDER — SODIUM CHLORIDE 0.9 % IV SOLN: 500.0000 mL | Freq: Once | INTRAVENOUS | Status: DC

## 2018-04-25 NOTE — Progress Notes (Signed)
Called to room to assist during endoscopic procedure.  Patient ID and intended procedure confirmed with present staff. Received instructions for my participation in the procedure from the performing physician.  

## 2018-04-25 NOTE — Progress Notes (Signed)
Pt's states no medical or surgical changes since previsit or office visit. 

## 2018-04-25 NOTE — Progress Notes (Signed)
Report to PACU, RN, vss, BBS= Clear.  

## 2018-04-25 NOTE — Patient Instructions (Signed)
*   handout on polyps given *  YOU HAD AN ENDOSCOPIC PROCEDURE TODAY AT THE [facility]:   Refer to the procedure report that was given to you for any specific questions about what was found during the examination.  If the procedure report does not answer your questions, please call your gastroenterologist to clarify.  If you requested that your care partner not be given the details of your procedure findings, then the procedure report has been included in a sealed envelope for you to review at your convenience later.  YOU SHOULD EXPECT: Some feelings of bloating in the abdomen. Passage of more gas than usual.  Walking can help get rid of the air that was put into your GI tract during the procedure and reduce the bloating. If you had a lower endoscopy (such as a colonoscopy or flexible sigmoidoscopy) you may notice spotting of blood in your stool or on the toilet paper. If you underwent a bowel prep for your procedure, you may not have a normal bowel movement for a few days.  Please Note:  You might notice some irritation and congestion in your nose or some drainage.  This is from the oxygen used during your procedure.  There is no need for concern and it should clear up in a day or so.  SYMPTOMS TO REPORT IMMEDIATELY:   Following lower endoscopy (colonoscopy or flexible sigmoidoscopy):  Excessive amounts of blood in the stool  Significant tenderness or worsening of abdominal pains  Swelling of the abdomen that is new, acute  Fever of 100F or higher   For urgent or emergent issues, a gastroenterologist can be reached at any hour by calling [phone].   DIET:  We do recommend a small meal at first, but then you may proceed to your regular diet.  Drink plenty of fluids but you should avoid alcoholic beverages for 24 hours.  ACTIVITY:  You should plan to take it easy for the rest of today and you should NOT DRIVE or use heavy machinery until tomorrow (because of the sedation  medicines used during the test).    FOLLOW UP: Our staff will call the number listed on your records the next business day following your procedure to check on you and address any questions or concerns that you may have regarding the information given to you following your procedure. If we do not reach you, we will leave a message.  However, if you are feeling well and you are not experiencing any problems, there is no need to return our call.  We will assume that you have returned to your regular daily activities without incident.  If any biopsies were taken you will be contacted by phone or by letter within the next 1-3 weeks.  Please call us at [phone] if you have not heard about the biopsies in 3 weeks.    SIGNATURES/CONFIDENTIALITY: You and/or your care partner have signed paperwork which will be entered into your electronic medical record.  These signatures attest to the fact that that the information above on your After Visit Summary has been reviewed and is understood.  Full responsibility of the confidentiality of this discharge information lies with you and/or your care-partner. 

## 2018-04-25 NOTE — Op Note (Addendum)
[facility] Patient Name: Kathe Wirick Procedure Date: 04/25/2018 10:51 AM MRN: [id number] Endoscopist: Ladene Artist , MD Age: 72 Referring MD:  Date of Birth: Oct 27, 1945 Gender: Female Account #: [id number] Procedure:                Colonoscopy Indications:              Surveillance: Personal history of adenomatous                            polyps on last colonoscopy 5 years ago. Family                            history of colon cancer. Medicines:                Monitored Anesthesia Care Procedure:                Pre-Anesthesia Assessment:                           - Prior to the procedure, a History and Physical                            was performed, and patient medications and                            allergies were reviewed. The patient's tolerance of                            previous anesthesia was also reviewed. The risks                            and benefits of the procedure and the sedation                            options and risks were discussed with the patient.                            All questions were answered, and informed consent                            was obtained. Prior Anticoagulants: The patient has                            taken no previous anticoagulant or antiplatelet                            agents. ASA Grade Assessment: II - A patient with                            mild systemic disease. After reviewing the risks                            and benefits, the patient was deemed in  satisfactory condition to undergo the procedure.                           After obtaining informed consent, the colonoscope                            was passed under direct vision. Throughout the                            procedure, the patient's blood pressure, pulse, and                            oxygen saturations were monitored continuously. The                            Colonoscope was introduced through the  anus and                            advanced to the the cecum, identified by                            appendiceal orifice and ileocecal valve. The                            ileocecal valve, appendiceal orifice, and rectum                            were photographed. The quality of the bowel                            preparation was excellent. The colonoscopy was                            performed without difficulty. The patient tolerated                            the procedure well. Scope In: 9:55:00 AM Scope Out: 11:03:56 AM Scope Withdrawal Time: 0 hours 6 minutes 24 seconds  Total Procedure Duration: 1 hour 8 minutes 56 seconds  Findings:                 The perianal and digital rectal examinations were                            normal.                           A 5 mm polyp was found in the ascending colon. The                            polyp was sessile. The polyp was removed with a                            cold biopsy forceps. Resection and retrieval were  complete.                           Two medium-mouthed diverticula were found in the                            transverse colon.                           Internal hemorrhoids were found during                            retroflexion. The hemorrhoids were small and Grade                            I (internal hemorrhoids that do not prolapse).                           The exam was otherwise without abnormality on                            direct and retroflexion views. Complications:            No immediate complications. Estimated blood loss:                            None. Estimated Blood Loss:     Estimated blood loss: none. Impression:               - One 5 mm polyp in the ascending colon, removed                            with a cold biopsy forceps. Resected and retrieved.                           - Diverticulosis in the transverse colon.                           - Internal  hemorrhoids.                           - The examination was otherwise normal on direct                            and retroflexion views. Recommendation:           - Repeat colonoscopy in 5 years for surveillance.                           - Patient has a contact number available for                            emergencies. The signs and symptoms of potential                            delayed complications were discussed with the  patient. Return to normal activities tomorrow.                            Written discharge instructions were provided to the                            patient.                           - High fiber diet.                           - Continue present medications.                           - Await pathology results. Ladene Artist, MD 04/25/2018 11:06:33 AM This report has been signed electronically.

## 2018-04-26 ENCOUNTER — Telehealth: Payer: Self-pay

## 2018-04-26 DIAGNOSIS — M2550 Pain in unspecified joint: Secondary | ICD-10-CM | POA: Diagnosis not present

## 2018-04-26 DIAGNOSIS — M5136 Other intervertebral disc degeneration, lumbar region: Secondary | ICD-10-CM | POA: Diagnosis not present

## 2018-04-26 NOTE — Telephone Encounter (Signed)
  Follow up Call-  Call back number 04/25/2018  Post procedure Call Back phone  # [phone]  Permission to leave phone message Yes  Some recent data might be hidden     Patient questions:  Do you have a fever, pain , or abdominal swelling? No. Pain Score  0 *  Have you tolerated food without any problems? Yes.    Have you been able to return to your normal activities? Yes.    Do you have any questions about your discharge instructions: Diet   No. Medications  No. Follow up visit  No.  Do you have questions or concerns about your Care? No.  Actions: * If pain score is 4 or above: No action needed, pain <4.  No problems noted per pt. maw

## 2018-05-06 DIAGNOSIS — Z23 Encounter for immunization: Secondary | ICD-10-CM | POA: Diagnosis not present

## 2018-05-17 ENCOUNTER — Encounter: Payer: Self-pay | Admitting: Gastroenterology

## 2018-06-29 ENCOUNTER — Ambulatory Visit
Admission: RE | Admit: 2018-06-29 | Discharge: 2018-06-29 | Disposition: A | Discharge status: Home / Self Care | Payer: Medicare HMO | Source: Ambulatory Visit | Attending: Gynecology | Admitting: Gynecology

## 2018-06-29 DIAGNOSIS — Z1231 Encounter for screening mammogram for malignant neoplasm of breast: Secondary | ICD-10-CM

## 2018-07-05 ENCOUNTER — Encounter: Payer: Self-pay | Admitting: Gynecology

## 2018-07-05 ENCOUNTER — Ambulatory Visit: Payer: Medicare HMO | Admitting: Gynecology

## 2018-07-05 VITALS — BP 118/70 | Ht 59.0 in | Wt 106.0 lb

## 2018-07-05 DIAGNOSIS — N9089 Other specified noninflammatory disorders of vulva and perineum: Secondary | ICD-10-CM

## 2018-07-05 DIAGNOSIS — L900 Lichen sclerosus et atrophicus: Secondary | ICD-10-CM | POA: Diagnosis not present

## 2018-07-05 DIAGNOSIS — M8580 Other specified disorders of bone density and structure, unspecified site: Secondary | ICD-10-CM | POA: Diagnosis not present

## 2018-07-05 DIAGNOSIS — N952 Postmenopausal atrophic vaginitis: Secondary | ICD-10-CM

## 2018-07-05 DIAGNOSIS — Z01419 Encounter for gynecological examination (general) (routine) without abnormal findings: Secondary | ICD-10-CM | POA: Diagnosis not present

## 2018-07-05 MED ORDER — CLOBETASOL PROPIONATE 0.05 % EX CREA: TOPICAL_CREAM | CUTANEOUS | 1 refills | Status: DC

## 2018-07-05 NOTE — Progress Notes (Signed)
    Carla Alvarado 1945/07/15 [id number]        72 y.o.  [id number] for rest and pelvic exam.  Complaining of a lot of vulvar itching.  History of lichen sclerosis.  Has not used her clobetasol cream in a long time.  Is using vaginal estradiol cream twice weekly.  Past medical history,surgical history, problem list, medications, allergies, family history and social history were all reviewed and documented as reviewed in the EPIC chart.  ROS:  Performed with pertinent positives and negatives included in the history, assessment and plan.   Additional significant findings : None   Exam: [name] assistant Vitals:   07/05/18 0818  BP: 118/70  Weight: 106 lb (48.1 kg)  Height: 4\' 11"  (1.499 m)   Body mass index is 21.41 kg/m.  General appearance:  Normal affect, orientation and appearance. Skin: Grossly normal HEENT: Without gross lesions.  No cervical or supraclavicular adenopathy. Thyroid normal.  Lungs:  Clear without wheezing, rales or rhonchi Cardiac: RR, without RMG Abdominal:  Soft, nontender, without masses, guarding, rebound, organomegaly or hernia Breasts:  Examined lying and sitting without masses, retractions, discharge or axillary adenopathy. Pelvic:  Ext, BUS, Vagina: With atrophic changes.  Leukoplakic changes clitoral hood region.  Symmetrical blanching along both labia through the posterior fourchette consistent with lichen sclerosis  Adnexa: Without masses or tenderness    Anus and perineum: Normal   Rectovaginal: Normal sphincter tone without palpated masses or tenderness.    Assessment/Plan:  73 y.o. [id number] female for breast and pelvic exam.  Status post TVH in 1990  1. Lichen sclerosus.  Classic appearance along the labia.  Does have thickened leukoplakic area clitoral hood.  Discussed with patient possible VIN/carcinoma.  Recommended patient apply clobetasol nightly and then follow-up for a colposcopy appointment in 6 weeks to reinspect.  If changes are not  improved then we will plan on biopsy.  Patient understands the importance of follow-up. 2. Postmenopausal.  Uses estradiol cream twice weekly.  We will continue with this.  We have discussed the risks of absorption and systemic risks previously she is comfortable continuing.  Has supply but will call when needs more. 3. Osteopenia.  DEXA 2018 T score -2.1 FRAX 12% / 3.4%.  We discussed treatment options based on increased FRAX hip value and she declined.  Recommend follow-up DEXA now at 2-year interval and she will schedule in follow-up for this. 4. Pap smear 2019.  No Pap smear done today.  No history of significant abnormal Pap smears. 5. Mammography last week.  Continue with annual mammography's.  Breast exam normal today. 6. Colonoscopy 2019.  Repeat at their recommended interval. 7. Health maintenance.  No routine lab work done as patient does this elsewhere.  Follow-up for DEXA as scheduled.  Follow-up for vulvar reexamination in 6 to 8 weeks.  Additional time in excess of her breast and pelvic exam was spent in direct face to face counseling and coordination of care in regards to her vulvar lesion.    [name], 8:53 AM 07/05/2018

## 2018-07-05 NOTE — Patient Instructions (Signed)
Follow-up for the bone density as scheduled  Follow-up for reexamination of the vagina as discussed after applying the clobetasol cream nightly for 2 months

## 2018-08-03 DIAGNOSIS — C4339 Malignant melanoma of other parts of face: Secondary | ICD-10-CM | POA: Diagnosis not present

## 2018-08-03 DIAGNOSIS — Z6820 Body mass index (BMI) 20.0-20.9, adult: Secondary | ICD-10-CM | POA: Diagnosis not present

## 2018-08-15 DIAGNOSIS — S91311A Laceration without foreign body, right foot, initial encounter: Secondary | ICD-10-CM | POA: Diagnosis not present

## 2018-08-17 ENCOUNTER — Ambulatory Visit: Payer: Medicare HMO | Admitting: Gynecology

## 2018-08-17 ENCOUNTER — Encounter: Payer: Self-pay | Admitting: Gynecology

## 2018-08-17 VITALS — BP 118/76

## 2018-08-17 DIAGNOSIS — L900 Lichen sclerosus et atrophicus: Secondary | ICD-10-CM

## 2018-08-17 DIAGNOSIS — N904 Leukoplakia of vulva: Secondary | ICD-10-CM

## 2018-08-17 DIAGNOSIS — N952 Postmenopausal atrophic vaginitis: Secondary | ICD-10-CM

## 2018-08-17 NOTE — Patient Instructions (Signed)
Follow-up for reexamination during the summer

## 2018-08-17 NOTE — Progress Notes (Signed)
    Carla Alvarado 05-04-1946 [id number]        73 y.o.  [id number] presents for reexamination due to exam in January showing an area of leukoplakia in the periclitoral region.  Also classic lichen sclerosis changes on both labia majora.  She does have a history of lichen sclerosis.  Had not been using steroid cream.  I asked her to apply clobetasol nightly and to return now for reexamination.  She notes that the itching and irritation she was having has resolved.  Past medical history,surgical history, problem list, medications, allergies, family history and social history were all reviewed and documented in the EPIC chart.  Directed ROS with pertinent positives and negatives documented in the history of present illness/assessment and plan.  Exam: [name] assistant Vitals:   08/17/18 0908  BP: 118/76   General appearance:  Normal External BUS vagina with atrophic changes.  Blanching of the skin along both labia majora noted.  Prior area of leukoplakia not evident on gross exam.  Colposcopy performed after acetic acid cleanse of the entire vulva from the mons pubis to the perianal region.  No concerning changes were noted without evidence of acetowhite change or other lesions.  Assessment/Plan:  73 y.o. [id number] with lichen sclerosus responding nicely to clobetasol.  She will now taper off of the clobetasol and use it intermittently for symptoms.  I asked her to return during the summer at a 61-month interval for reexamination just to make sure everything remains stable instead of waiting the entire year when she comes in for her annual.  Patient will make this appointment and follow-up in 6 months.  She does have her bone density appointment tomorrow.    [name], 9:23 AM 08/17/2018

## 2018-08-18 ENCOUNTER — Ambulatory Visit (INDEPENDENT_AMBULATORY_CARE_PROVIDER_SITE_OTHER): Payer: Medicare HMO

## 2018-08-18 ENCOUNTER — Encounter: Payer: Self-pay | Admitting: Gynecology

## 2018-08-18 ENCOUNTER — Other Ambulatory Visit: Payer: Self-pay | Admitting: Gynecology

## 2018-08-18 DIAGNOSIS — M8589 Other specified disorders of bone density and structure, multiple sites: Secondary | ICD-10-CM | POA: Diagnosis not present

## 2018-08-18 DIAGNOSIS — M8580 Other specified disorders of bone density and structure, unspecified site: Secondary | ICD-10-CM

## 2018-08-18 DIAGNOSIS — Z780 Asymptomatic menopausal state: Secondary | ICD-10-CM

## 2018-08-30 NOTE — Telephone Encounter (Signed)
Patient informed with my chart message. 

## 2018-09-08 DIAGNOSIS — J029 Acute pharyngitis, unspecified: Secondary | ICD-10-CM | POA: Diagnosis not present

## 2018-09-08 DIAGNOSIS — R109 Unspecified abdominal pain: Secondary | ICD-10-CM | POA: Diagnosis not present

## 2018-09-08 DIAGNOSIS — R05 Cough: Secondary | ICD-10-CM | POA: Diagnosis not present

## 2018-10-07 ENCOUNTER — Other Ambulatory Visit: Payer: Self-pay | Admitting: *Deleted

## 2018-10-07 MED ORDER — NONFORMULARY OR COMPOUNDED ITEM: 4 refills | Status: DC

## 2018-10-07 NOTE — Telephone Encounter (Signed)
Refill called in. 

## 2018-10-26 DIAGNOSIS — Z0000 Encounter for general adult medical examination without abnormal findings: Secondary | ICD-10-CM | POA: Diagnosis not present

## 2018-10-26 DIAGNOSIS — E559 Vitamin D deficiency, unspecified: Secondary | ICD-10-CM | POA: Diagnosis not present

## 2018-10-26 DIAGNOSIS — L309 Dermatitis, unspecified: Secondary | ICD-10-CM | POA: Diagnosis not present

## 2018-10-26 DIAGNOSIS — G43909 Migraine, unspecified, not intractable, without status migrainosus: Secondary | ICD-10-CM | POA: Diagnosis not present

## 2018-10-26 DIAGNOSIS — E7800 Pure hypercholesterolemia, unspecified: Secondary | ICD-10-CM | POA: Diagnosis not present

## 2018-10-26 DIAGNOSIS — E039 Hypothyroidism, unspecified: Secondary | ICD-10-CM | POA: Diagnosis not present

## 2018-10-26 DIAGNOSIS — R69 Illness, unspecified: Secondary | ICD-10-CM | POA: Diagnosis not present

## 2018-10-26 DIAGNOSIS — M8580 Other specified disorders of bone density and structure, unspecified site: Secondary | ICD-10-CM | POA: Diagnosis not present

## 2018-10-26 DIAGNOSIS — G4700 Insomnia, unspecified: Secondary | ICD-10-CM | POA: Diagnosis not present

## 2018-10-28 DIAGNOSIS — D225 Melanocytic nevi of trunk: Secondary | ICD-10-CM | POA: Diagnosis not present

## 2018-10-28 DIAGNOSIS — L820 Inflamed seborrheic keratosis: Secondary | ICD-10-CM | POA: Diagnosis not present

## 2018-10-28 DIAGNOSIS — Z1283 Encounter for screening for malignant neoplasm of skin: Secondary | ICD-10-CM | POA: Diagnosis not present

## 2018-10-28 DIAGNOSIS — Z85828 Personal history of other malignant neoplasm of skin: Secondary | ICD-10-CM | POA: Diagnosis not present

## 2018-10-28 DIAGNOSIS — Z08 Encounter for follow-up examination after completed treatment for malignant neoplasm: Secondary | ICD-10-CM | POA: Diagnosis not present

## 2019-01-07 DIAGNOSIS — Z1159 Encounter for screening for other viral diseases: Secondary | ICD-10-CM | POA: Diagnosis not present

## 2019-01-10 DIAGNOSIS — L905 Scar conditions and fibrosis of skin: Secondary | ICD-10-CM | POA: Diagnosis not present

## 2019-01-10 DIAGNOSIS — E785 Hyperlipidemia, unspecified: Secondary | ICD-10-CM | POA: Diagnosis not present

## 2019-01-10 DIAGNOSIS — N189 Chronic kidney disease, unspecified: Secondary | ICD-10-CM | POA: Diagnosis not present

## 2019-01-10 DIAGNOSIS — R69 Illness, unspecified: Secondary | ICD-10-CM | POA: Diagnosis not present

## 2019-01-10 DIAGNOSIS — C4339 Malignant melanoma of other parts of face: Secondary | ICD-10-CM | POA: Diagnosis not present

## 2019-01-10 DIAGNOSIS — Z85820 Personal history of malignant melanoma of skin: Secondary | ICD-10-CM | POA: Diagnosis not present

## 2019-01-10 DIAGNOSIS — E039 Hypothyroidism, unspecified: Secondary | ICD-10-CM | POA: Diagnosis not present

## 2019-02-15 ENCOUNTER — Other Ambulatory Visit: Payer: Self-pay

## 2019-02-16 ENCOUNTER — Ambulatory Visit: Payer: Medicare HMO | Admitting: Gynecology

## 2019-02-16 ENCOUNTER — Encounter: Payer: Self-pay | Admitting: Gynecology

## 2019-02-16 VITALS — BP 118/74

## 2019-02-16 DIAGNOSIS — N952 Postmenopausal atrophic vaginitis: Secondary | ICD-10-CM | POA: Diagnosis not present

## 2019-02-16 DIAGNOSIS — L900 Lichen sclerosus et atrophicus: Secondary | ICD-10-CM

## 2019-02-16 NOTE — Progress Notes (Signed)
    Carla Alvarado 03/21/1946 [id number]        73 y.o.  [id number] presents for follow-up exam.  History of lichen sclerosis.  Was seen January for annual exam with an area in the periclitoral region that appeared leukoplakic.  She applied betazole nightly and returned at 6 weeks for colposcopy.  The prior changes were resolved with no concerning findings on colposcopy other than blanching of the skin from the periclitoral to the perineal body bilaterally consistent with lichen sclerosis.  Has done well since using clobetasol intermittently for irritative symptoms.  Is also using estradiol vaginal cream twice weekly  Past medical history,surgical history, problem list, medications, allergies, family history and social history were all reviewed and documented in the EPIC chart.  Directed ROS with pertinent positives and negatives documented in the history of present illness/assessment and plan.  Exam: [name] assistant Vitals:   02/16/19 0833  BP: 118/74   General appearance:  Normal Abdomen soft nontender without masses guarding rebound Pelvic external BUS vagina with atrophic changes.  Symmetrical blanching of the skin from the perineal body bilaterally.  No concerning changes.  Bimanual without masses or tenderness  Assessment/Plan:  73 y.o. [id number] with lichen sclerosus doing well with intermittent clobetasol.  We will follow-up in January when due for annual exam for reexamination.    [name], 8:46 AM 02/16/2019

## 2019-02-16 NOTE — Patient Instructions (Signed)
Follow-up in January when due for annual exam.

## 2019-03-29 ENCOUNTER — Encounter: Payer: Self-pay | Admitting: Gynecology

## 2019-04-26 DIAGNOSIS — E7800 Pure hypercholesterolemia, unspecified: Secondary | ICD-10-CM | POA: Diagnosis not present

## 2019-04-26 DIAGNOSIS — E039 Hypothyroidism, unspecified: Secondary | ICD-10-CM | POA: Diagnosis not present

## 2019-04-26 DIAGNOSIS — G43909 Migraine, unspecified, not intractable, without status migrainosus: Secondary | ICD-10-CM | POA: Diagnosis not present

## 2019-04-26 DIAGNOSIS — R69 Illness, unspecified: Secondary | ICD-10-CM | POA: Diagnosis not present

## 2019-04-26 DIAGNOSIS — E559 Vitamin D deficiency, unspecified: Secondary | ICD-10-CM | POA: Diagnosis not present

## 2019-04-26 DIAGNOSIS — G4700 Insomnia, unspecified: Secondary | ICD-10-CM | POA: Diagnosis not present

## 2019-04-28 DIAGNOSIS — Z08 Encounter for follow-up examination after completed treatment for malignant neoplasm: Secondary | ICD-10-CM | POA: Diagnosis not present

## 2019-04-28 DIAGNOSIS — Z1283 Encounter for screening for malignant neoplasm of skin: Secondary | ICD-10-CM | POA: Diagnosis not present

## 2019-04-28 DIAGNOSIS — Z85820 Personal history of malignant melanoma of skin: Secondary | ICD-10-CM | POA: Diagnosis not present

## 2019-04-28 DIAGNOSIS — D225 Melanocytic nevi of trunk: Secondary | ICD-10-CM | POA: Diagnosis not present

## 2019-04-28 DIAGNOSIS — L728 Other follicular cysts of the skin and subcutaneous tissue: Secondary | ICD-10-CM | POA: Diagnosis not present

## 2019-05-12 DIAGNOSIS — C4339 Malignant melanoma of other parts of face: Secondary | ICD-10-CM | POA: Diagnosis not present

## 2019-05-24 DIAGNOSIS — L720 Epidermal cyst: Secondary | ICD-10-CM | POA: Diagnosis not present

## 2019-06-01 ENCOUNTER — Other Ambulatory Visit: Payer: Self-pay | Admitting: Obstetrics and Gynecology

## 2019-06-01 DIAGNOSIS — Z1231 Encounter for screening mammogram for malignant neoplasm of breast: Secondary | ICD-10-CM

## 2019-07-10 ENCOUNTER — Encounter: Payer: Medicare HMO | Admitting: Gynecology

## 2019-07-19 ENCOUNTER — Other Ambulatory Visit: Payer: Self-pay

## 2019-07-19 ENCOUNTER — Ambulatory Visit
Admission: RE | Admit: 2019-07-19 | Discharge: 2019-07-19 | Disposition: A | Discharge status: Home / Self Care | Payer: Medicare HMO | Source: Ambulatory Visit | Attending: Obstetrics and Gynecology | Admitting: Obstetrics and Gynecology

## 2019-07-19 DIAGNOSIS — Z1231 Encounter for screening mammogram for malignant neoplasm of breast: Secondary | ICD-10-CM

## 2019-07-20 ENCOUNTER — Other Ambulatory Visit: Payer: Self-pay

## 2019-07-21 ENCOUNTER — Ambulatory Visit: Payer: Medicare HMO | Admitting: Obstetrics and Gynecology

## 2019-07-21 ENCOUNTER — Encounter: Payer: Self-pay | Admitting: Obstetrics and Gynecology

## 2019-07-21 DIAGNOSIS — Z01419 Encounter for gynecological examination (general) (routine) without abnormal findings: Secondary | ICD-10-CM

## 2019-07-21 DIAGNOSIS — L900 Lichen sclerosus et atrophicus: Secondary | ICD-10-CM

## 2019-07-21 NOTE — Patient Instructions (Signed)
It was nice to meet you today! We will plan to repeat the DEXA scan next year.  Continue weight bearing exercise, and vitamin D/calcium intake. Continue [place] exercises and use clobetasol cream on the bottom as needed for itching

## 2019-07-21 NOTE — Progress Notes (Signed)
Carla Alvarado [birth year] [id number]  SUBJECTIVE:  75 y.o. [id number] female for annual routine gynecologic exam. She has no gynecologic concerns other than occasional itching over the vulvar area due to lichen sclerosis.  She has not been using clobetasol but does have some left.    Current Outpatient Medications  Medication Sig Dispense Refill  . clobetasol cream (TEMOVATE) 0.05 % apply externally at bedtime 30 g 1  . diazepam (VALIUM) 2 MG tablet Take 2 mg by mouth at bedtime as needed for anxiety.    Marland Kitchen levothyroxine (SYNTHROID, LEVOTHROID) 88 MCG tablet Take 88 mcg by mouth daily before breakfast.    . LOVASTATIN PO Take 40 mg by mouth.     . Multiple Vitamin (MULTIVITAMIN PO) Take by mouth.      . NONFORMULARY OR COMPOUNDED ITEM Estradiol vaginal cream 0.02% insert twice weekly 24 each 4  . OVER THE COUNTER MEDICATION Calcium 600 mg, one capsule daily.    Marland Kitchen OVER THE COUNTER MEDICATION Vitamin D 3, one capsule daily.    Marland Kitchen OVER THE COUNTER MEDICATION Vitamin B 12, 2000 mcg. One tablet daily.    . SUMAtriptan (IMITREX) 100 MG tablet Take 100 mg by mouth every 2 (two) hours as needed for migraine. May repeat in 2 hours if headache persists or recurs.    Marland Kitchen zolpidem (AMBIEN) 5 MG tablet Take 5 mg by mouth at bedtime as needed for sleep.     No current facility-administered medications for this visit.   Allergies: Morphine and related and Levothyroxine sodium  No LMP recorded. Patient has had a hysterectomy.  Past medical history,surgical history, problem list, medications, allergies, family history and social history were all reviewed and documented as reviewed in the EPIC chart.  ROS:  Feeling well. No dyspnea or chest pain on exertion.  No abdominal pain, change in bowel habits, black or bloody stools.  No urinary tract symptoms. GYN ROS: normal menses, no abnormal bleeding, pelvic pain or discharge, no breast pain or new or enlarging lumps on self exam. No neurological  complaints.    OBJECTIVE:  BP 118/76   Ht 4' 10.5" (1.486 m)   Wt 107 lb (48.5 kg)   BMI 21.98 kg/m  The patient appears well, alert, oriented x 3, in no distress. ENT normal.  Neck supple. No cervical or supraclavicular adenopathy or thyromegaly.  Lungs are clear, good air entry, no wheezes, rhonchi or rales. S1 and S2 normal, no murmurs, regular rate and rhythm.  Abdomen soft without tenderness, guarding, mass or organomegaly.  Neurological is normal, no focal findings.  BREAST EXAM: breasts appear normal, no suspicious masses, no skin or nipple changes or axillary nodes  PELVIC EXAM: VULVA: atrophic and leukoplakia over the anterior portion and the clitoral hood, blanching along both labia minora and posterior fourchette, no masses, tenderness or lesions, VAGINA: normal appearing vagina with normal color and discharge, no lesions, CERVIX: surgically absent, UTERUS: surgically absent, vaginal cuff well healed, ADNEXA: normal adnexa in size, nontender and no masses. ANUS/PERINEUM: Normal appearance  Chaperone: [name] present during the examination  ASSESSMENT:  75 y.o. [id number] here for annual gynecologic exam  PLAN:   1. Postmenopausal. S/p TVH in 1990. Using compounded vaginal estrogen cream.  She tells me she is divorced and open to relationships so we discussed taking care to avoid injury to vulvar areas with her skin condition. 2. Lichen sclerosis. Has been closely followed in the last year with repeat examinations after using clobetasol cream.  Recommended  to use the clobetasol prn if having symptoms.  If no improvement, may need to restart tapering therapy.  If still no improvement, then a repeat vulvar biopsy would need to be considered. 3. Pap smear 06/2017 normal. Not repeated today. No significant history of abnormal Pap smears. Next Pap smear due 2022 if she would like to continue screening and we can address at that time. 4. Osteopenia. DEXA 07/2018 T score -2.2, slight  loss in spine compared to 2018.  Continue weight bearing exercise and calcium + vitamin D and repeat DEXA next year. 5. Colonoscopy 2019. Recommended that she continue per the prescribed interval.  6. Mammogram 2 days ago was normal. Will continue with annual mammography. Breast exam normal today. 7. Health maintenance.  No lab work as she has this completed with her primary care provider.    Return annually or sooner, prn.  Larey Days MD  07/21/19

## 2019-10-03 DIAGNOSIS — N390 Urinary tract infection, site not specified: Secondary | ICD-10-CM | POA: Diagnosis not present

## 2019-10-25 DIAGNOSIS — E7800 Pure hypercholesterolemia, unspecified: Secondary | ICD-10-CM | POA: Diagnosis not present

## 2019-10-25 DIAGNOSIS — G43909 Migraine, unspecified, not intractable, without status migrainosus: Secondary | ICD-10-CM | POA: Diagnosis not present

## 2019-10-25 DIAGNOSIS — Z23 Encounter for immunization: Secondary | ICD-10-CM | POA: Diagnosis not present

## 2019-10-25 DIAGNOSIS — Z79899 Other long term (current) drug therapy: Secondary | ICD-10-CM | POA: Diagnosis not present

## 2019-10-25 DIAGNOSIS — G4700 Insomnia, unspecified: Secondary | ICD-10-CM | POA: Diagnosis not present

## 2019-10-25 DIAGNOSIS — R69 Illness, unspecified: Secondary | ICD-10-CM | POA: Diagnosis not present

## 2019-10-25 DIAGNOSIS — M8580 Other specified disorders of bone density and structure, unspecified site: Secondary | ICD-10-CM | POA: Diagnosis not present

## 2019-10-25 DIAGNOSIS — Z0000 Encounter for general adult medical examination without abnormal findings: Secondary | ICD-10-CM | POA: Diagnosis not present

## 2019-10-25 DIAGNOSIS — E039 Hypothyroidism, unspecified: Secondary | ICD-10-CM | POA: Diagnosis not present

## 2019-11-27 DIAGNOSIS — M62838 Other muscle spasm: Secondary | ICD-10-CM | POA: Diagnosis not present

## 2019-12-14 ENCOUNTER — Other Ambulatory Visit: Payer: Self-pay | Admitting: *Deleted

## 2019-12-14 MED ORDER — NONFORMULARY OR COMPOUNDED ITEM: 4 refills | Status: DC

## 2019-12-14 NOTE — Telephone Encounter (Signed)
Annual exam was on 07/21/19. Rx called in.

## 2020-01-01 DIAGNOSIS — R69 Illness, unspecified: Secondary | ICD-10-CM | POA: Diagnosis not present

## 2020-01-05 ENCOUNTER — Ambulatory Visit: Payer: Medicare HMO | Attending: Internal Medicine

## 2020-01-05 DIAGNOSIS — Z20822 Contact with and (suspected) exposure to covid-19: Secondary | ICD-10-CM

## 2020-01-05 DIAGNOSIS — L821 Other seborrheic keratosis: Secondary | ICD-10-CM | POA: Diagnosis not present

## 2020-01-05 DIAGNOSIS — Z1283 Encounter for screening for malignant neoplasm of skin: Secondary | ICD-10-CM | POA: Diagnosis not present

## 2020-01-05 DIAGNOSIS — Z08 Encounter for follow-up examination after completed treatment for malignant neoplasm: Secondary | ICD-10-CM | POA: Diagnosis not present

## 2020-01-05 DIAGNOSIS — L820 Inflamed seborrheic keratosis: Secondary | ICD-10-CM | POA: Diagnosis not present

## 2020-01-05 DIAGNOSIS — D225 Melanocytic nevi of trunk: Secondary | ICD-10-CM | POA: Diagnosis not present

## 2020-01-05 DIAGNOSIS — Z85820 Personal history of malignant melanoma of skin: Secondary | ICD-10-CM | POA: Diagnosis not present

## 2020-01-06 LAB — NOVEL CORONAVIRUS, NAA: SARS-CoV-2, NAA: NOT DETECTED

## 2020-01-06 LAB — SARS-COV-2, NAA 2 DAY TAT

## 2020-02-06 ENCOUNTER — Ambulatory Visit: Payer: Medicare HMO | Admitting: Neurology

## 2020-03-12 ENCOUNTER — Encounter: Payer: Self-pay | Admitting: Neurology

## 2020-03-12 ENCOUNTER — Ambulatory Visit: Payer: Medicare HMO | Admitting: Neurology

## 2020-03-12 VITALS — BP 120/73 | HR 70 | Ht 58.5 in | Wt 110.0 lb

## 2020-03-12 DIAGNOSIS — R202 Paresthesia of skin: Secondary | ICD-10-CM

## 2020-03-12 NOTE — Progress Notes (Signed)
Chief Complaint  Patient presents with  . New Patient (Initial Visit)    She has intermittent "electrical sensations", along with cramps in her feet. She has also felt the electrical sensation in her chest. Her PCP started her on magnesium which has helped. (Last seen in 2016 for tremors).  . PCP    Carla Melter, MD    HISTORICAL  Carla Alvarado is a 74 year old female, seen in request by her primary care physician Dr. Olen Alvarado, Carla Alvarado, for evaluation of transient paresthesia involving different body part, initial evaluation was on March 12, 2020  I reviewed and summarized the referring note. Hypothyroidism, on supplement Chronic Migraine Chronic insomnia  She reported excessive stress recently, difficulty sleeping, take Ambien, Valium as needed, around May 2021, she experienced transient symptoms, she described vibratory electronic shooting sensation across the the right and left foot, only few seconds, occasionally similar sensation across her right chest,  During the spells, she denied loss of consciousness, no loss of function, no gait abnormality, no persistent sensory loss or motor deficit  She was seen by primary care doctor in May 2021, laboratory evaluation showed normal CMP, TSH, with mild elevated LDL 136, cholesterol 230  She also started on magnesium supplement based on suggestion, which has greatly improved her symptoms, and rarely happen now  I also reviewed previous evaluation by my colleague Dr. Janann Alvarado in 2015, she complains of transient bilateral hands tremor that lasted about 2 months, improved  I personally reviewed MRI of the brain in August 2015, there was no acute abnormality.  REVIEW OF SYSTEMS: Full 14 system review of systems performed and notable only for as above All other review of systems were negative.  ALLERGIES: Allergies  Allergen Reactions  . Morphine And Related Nausea And Vomiting  . Levothyroxine Sodium Hives    Generic brand     HOME MEDICATIONS: Current Outpatient Medications  Medication Sig Dispense Refill  . clobetasol cream (TEMOVATE) 0.05 % apply externally at bedtime 30 g 1  . diazepam (VALIUM) 2 MG tablet Take 2 mg by mouth at bedtime as needed for anxiety.    Marland Kitchen levothyroxine (SYNTHROID, LEVOTHROID) 88 MCG tablet Take 88 mcg by mouth daily before breakfast.    . LOVASTATIN PO Take 40 mg by mouth.     Marland Kitchen MAGNESIUM GLYCINATE PO Take 200 mg by mouth daily.    . Multiple Vitamin (MULTIVITAMIN PO) Take by mouth.      . NONFORMULARY OR COMPOUNDED ITEM Estradiol vaginal cream 0.02% insert twice weekly 24 each 4  . OVER THE COUNTER MEDICATION Calcium 600 mg, one capsule daily.    Marland Kitchen OVER THE COUNTER MEDICATION Vitamin D 3, one capsule daily.    Marland Kitchen OVER THE COUNTER MEDICATION Vitamin B 12, 2000 mcg. One tablet daily.    . SUMAtriptan (IMITREX) 100 MG tablet Take 100 mg by mouth every 2 (two) hours as needed for migraine. May repeat in 2 hours if headache persists or recurs.    Marland Kitchen zolpidem (AMBIEN) 5 MG tablet Take 5 mg by mouth at bedtime as needed for sleep.     No current facility-administered medications for this visit.    PAST MEDICAL HISTORY: Past Medical History:  Diagnosis Date  . Allergy   . Anemia   . Anxiety   . Arthritis   . Cancer ([place])    Melanoma  . Depression   . Family history of breast cancer   . Family history of colon cancer   . Family history  of Lynch syndrome   . Family history of prostate cancer   . GERD (gastroesophageal reflux disease)   . History of renal stone   . Hypercholesteremia   . Hypothyroid   . Lichen sclerosus   . Migraine   . Osteopenia 07/2018   T score -2.2 with slight loss at spine stable at the hips  . Personal history of malignant melanoma   . Pneumonia   . Skin cancer 07/2015  . Tubular adenoma of colon 01/2013    PAST SURGICAL HISTORY: Past Surgical History:  Procedure Laterality Date  . BREAST EXCISIONAL BIOPSY Right   . breast papilloma    .  CESAREAN SECTION    . MELANOMA EXCISION WITH SENTINEL LYMPH NODE BIOPSY    . VAGINAL HYSTERECTOMY  1990    FAMILY HISTORY: Family History  Problem Relation Age of Onset  . Hypertension Mother   . Colon cancer Mother        mets from breast cancer  . Heart disease Mother   . Breast cancer Mother        Age 68, mets all over   . Heart disease Brother   . Diabetes Maternal Grandmother   . Heart disease Maternal Grandfather   . Breast cancer Maternal Aunt 98  . Breast cancer Paternal Aunt        Age 79  . Breast cancer Maternal Aunt        Age 32  . Breast cancer Sister 39       maternal half sister  . Breast cancer Other        Lynch syndrome  . Prostate cancer Cousin        maternal first cousin  . Colon cancer Cousin        maternal first cousin  . Cancer Cousin        NOS - maternal first cousin  . Breast cancer Cousin 14       TN breast cancer, maternal first cousin  . Esophageal cancer Neg Hx   . Rectal cancer Neg Hx   . Stomach cancer Neg Hx     SOCIAL HISTORY: Social History   Socioeconomic History  . Marital status: Married    Spouse name: Carla Alvarado  . Number of children: 2  . Years of education: 12+  . Highest education level: Not on file  Occupational History    Employer: [employer]  Tobacco Use  . Smoking status: Never Smoker  . Smokeless tobacco: Never Used  Vaping Use  . Vaping Use: Never used  Substance and Sexual Activity  . Alcohol use: Yes    Alcohol/week: 0.0 standard drinks    Comment: occassionally  . Drug use: No  . Sexual activity: Yes    Birth control/protection: Surgical    Comment: 1st intercourse 74 yo-Fewer than 5 partners  Other Topics Concern  . Not on file  Social History Narrative   Patient lives at home with Husband Carla Alvarado    Patient has 2 children.    Patient works at [employer].    Patient has 2 years of college.    Patient is right handed         Social Determinants of Health   Financial Resource Strain:   .  Difficulty of Paying Living Expenses: Not on file  Food Insecurity:   . Worried About Charity fundraiser in the Last Year: Not on file  . Ran Out of Food in the Last Year: Not on  file  Transportation Needs:   . Film/video editor (Medical): Not on file  . Lack of Transportation (Non-Medical): Not on file  Physical Activity:   . Days of Exercise per Week: Not on file  . Minutes of Exercise per Session: Not on file  Stress:   . Feeling of Stress : Not on file  Social Connections:   . Frequency of Communication with Friends and Family: Not on file  . Frequency of Social Gatherings with Friends and Family: Not on file  . Attends Religious Services: Not on file  . Active Member of Clubs or Organizations: Not on file  . Attends Archivist Meetings: Not on file  . Marital Status: Not on file  Intimate Partner Violence:   . Fear of Current or Ex-Partner: Not on file  . Emotionally Abused: Not on file  . Physically Abused: Not on file  . Sexually Abused: Not on file     PHYSICAL EXAM   Vitals:   03/12/20 0736  BP: 120/73  Pulse: 70  Weight: 110 lb (49.9 kg)  Height: 4' 10.5" (1.486 m)   Not recorded     Body mass index is 22.6 kg/m.  PHYSICAL EXAMNIATION:  Gen: NAD, conversant, well nourised, well groomed                     Cardiovascular: Regular rate rhythm, no peripheral edema, warm, nontender. Eyes: Conjunctivae clear without exudates or hemorrhage Neck: Supple, no carotid bruits. Pulmonary: Clear to auscultation bilaterally   NEUROLOGICAL EXAM:  MENTAL STATUS: Speech:    Speech is normal; fluent and spontaneous with normal comprehension.  Cognition:     Orientation to time, place and person     Normal recent and remote memory     Normal Attention span and concentration     Normal Language, naming, repeating,spontaneous speech     Fund of knowledge   CRANIAL NERVES: CN II: Visual fields are full to confrontation. Pupils are round equal and  briskly reactive to light. CN III, IV, VI: extraocular movement are normal. No ptosis. CN V: Facial sensation is intact to light touch CN VII: Face is symmetric with normal eye closure  CN VIII: Hearing is normal to causal conversation. CN IX, X: Phonation is normal. CN XI: Head turning and shoulder shrug are intact  MOTOR: There is no pronator drift of out-stretched arms. Muscle bulk and tone are normal. Muscle strength is normal.  REFLEXES: Reflexes are 2+ and symmetric at the biceps, triceps, knees, and ankles. Plantar responses are flexor.  SENSORY: Intact to light touch, pinprick and vibratory sensation are intact in fingers and toes.  COORDINATION: There is no trunk or limb dysmetria noted.  GAIT/STANCE: Posture is normal. Gait is steady with normal steps, base, arm swing, and turning. Heel and toe walking are normal. Tandem gait is normal.  Romberg is absent.   DIAGNOSTIC DATA (LABS, IMAGING, TESTING) - I reviewed patient records, labs, notes, testing and imaging myself where available.   ASSESSMENT AND PLAN  Carla Alvarado is a 74 y.o. female   Transient paresthesia involving chest, lower extremity,  Symptoms has mostly disappeared with magnesium supplement,  Essentially normal neurological examination  Basically normal MRI of the brain,  This is most likely related to her recent worsening anxiety, insomnia,  Will only return to clinic for new issues   Marcial Pacas, M.D. Ph.D.  [facility] [address], [place], [place] [address] Ph: [phone]  Fax: [phone]  CC:  Carla Alvarado, [place],  [place] [address]

## 2020-05-01 DIAGNOSIS — E039 Hypothyroidism, unspecified: Secondary | ICD-10-CM | POA: Diagnosis not present

## 2020-05-01 DIAGNOSIS — G4700 Insomnia, unspecified: Secondary | ICD-10-CM | POA: Diagnosis not present

## 2020-05-01 DIAGNOSIS — Z131 Encounter for screening for diabetes mellitus: Secondary | ICD-10-CM | POA: Diagnosis not present

## 2020-05-01 DIAGNOSIS — E7800 Pure hypercholesterolemia, unspecified: Secondary | ICD-10-CM | POA: Diagnosis not present

## 2020-05-01 DIAGNOSIS — G43909 Migraine, unspecified, not intractable, without status migrainosus: Secondary | ICD-10-CM | POA: Diagnosis not present

## 2020-05-01 DIAGNOSIS — R69 Illness, unspecified: Secondary | ICD-10-CM | POA: Diagnosis not present

## 2020-06-03 ENCOUNTER — Other Ambulatory Visit: Payer: Self-pay | Admitting: Obstetrics and Gynecology

## 2020-06-03 DIAGNOSIS — Z0000 Encounter for general adult medical examination without abnormal findings: Secondary | ICD-10-CM

## 2020-06-10 DIAGNOSIS — Z20822 Contact with and (suspected) exposure to covid-19: Secondary | ICD-10-CM | POA: Diagnosis not present

## 2020-07-19 ENCOUNTER — Other Ambulatory Visit: Payer: Self-pay

## 2020-07-19 ENCOUNTER — Ambulatory Visit
Admission: RE | Admit: 2020-07-19 | Discharge: 2020-07-19 | Disposition: A | Discharge status: Home / Self Care | Payer: Medicare HMO | Source: Ambulatory Visit | Attending: Obstetrics and Gynecology | Admitting: Obstetrics and Gynecology

## 2020-07-19 ENCOUNTER — Ambulatory Visit: Payer: Medicare HMO

## 2020-07-19 DIAGNOSIS — Z0000 Encounter for general adult medical examination without abnormal findings: Secondary | ICD-10-CM

## 2020-07-22 ENCOUNTER — Encounter: Payer: Medicare HMO | Admitting: Obstetrics and Gynecology

## 2020-07-26 ENCOUNTER — Other Ambulatory Visit: Payer: Self-pay

## 2020-07-26 ENCOUNTER — Ambulatory Visit (INDEPENDENT_AMBULATORY_CARE_PROVIDER_SITE_OTHER): Payer: Medicare HMO | Admitting: Obstetrics and Gynecology

## 2020-07-26 ENCOUNTER — Encounter: Payer: Self-pay | Admitting: Obstetrics and Gynecology

## 2020-07-26 VITALS — BP 122/80 | Ht <= 58 in | Wt 108.0 lb

## 2020-07-26 DIAGNOSIS — L900 Lichen sclerosus et atrophicus: Secondary | ICD-10-CM

## 2020-07-26 DIAGNOSIS — M8580 Other specified disorders of bone density and structure, unspecified site: Secondary | ICD-10-CM

## 2020-07-26 DIAGNOSIS — Z01419 Encounter for gynecological examination (general) (routine) without abnormal findings: Secondary | ICD-10-CM

## 2020-07-26 MED ORDER — CLOBETASOL PROPIONATE 0.05 % EX CREA: TOPICAL_CREAM | CUTANEOUS | 1 refills | Status: DC

## 2020-07-26 NOTE — Progress Notes (Signed)
Carla Alvarado 08/05/1945 [id number]  SUBJECTIVE:  75 y.o. [id number] female for annual routine gynecologic exam. She has no gynecologic concerns, just occasional itching over the vulvar area due to lichen sclerosus controlled with rare use of clobetasol cream.  Current Outpatient Medications  Medication Sig Dispense Refill  . diazepam (VALIUM) 2 MG tablet Take 2 mg by mouth at bedtime as needed for anxiety.    Marland Kitchen levothyroxine (SYNTHROID, LEVOTHROID) 88 MCG tablet Take 88 mcg by mouth daily before breakfast.    . LOVASTATIN PO Take 40 mg by mouth.    Marland Kitchen MAGNESIUM GLYCINATE PO Take 200 mg by mouth daily.    . Multiple Vitamin (MULTIVITAMIN PO) Take by mouth.    . NONFORMULARY OR COMPOUNDED ITEM Estradiol vaginal cream 0.02% insert twice weekly 24 each 4  . OVER THE COUNTER MEDICATION Calcium 600 mg, one capsule daily.    Marland Kitchen OVER THE COUNTER MEDICATION Vitamin D 3, one capsule daily.    Marland Kitchen OVER THE COUNTER MEDICATION Vitamin B 12, 2000 mcg. One tablet daily.    . SUMAtriptan (IMITREX) 100 MG tablet Take 100 mg by mouth every 2 (two) hours as needed for migraine. May repeat in 2 hours if headache persists or recurs.    Marland Kitchen zolpidem (AMBIEN) 5 MG tablet Take 5 mg by mouth at bedtime as needed for sleep.    . clobetasol cream (TEMOVATE) 0.05 % apply externally to external vulvar area at bedtime 60 g 1   No current facility-administered medications for this visit.   Allergies: Morphine and related and Levothyroxine sodium  No LMP recorded. Patient has had a hysterectomy.  Past medical history,surgical history, problem list, medications, allergies, family history and social history were all reviewed and documented as reviewed in the EPIC chart.  ROS:  Pertinent positives and negatives as reviewed in HPI   OBJECTIVE:  BP 122/80 (BP Location: Right Arm, Patient Position: Sitting, Cuff Size: Normal)   Ht 4\' 10"  (1.473 m)   Wt 108 lb (49 kg)   BMI 22.57 kg/m  The patient appears well, alert,  oriented, in no distress.  BREAST EXAM: breasts appear normal, no suspicious masses, no skin or nipple changes or axillary nodes  PELVIC EXAM: VULVA: atrophic and leukoplakia over the anterior portion and the clitoral hood, blanching along both labia minora and posterior fourchette, no masses, tenderness or lesions, VAGINA: normal appearing vagina with atrophic change, normal color and discharge, no lesions, CERVIX: surgically absent, UTERUS: surgically absent, vaginal cuff well healed, ADNEXA: normal adnexa in size, nontender and no masses. ANUS/PERINEUM: Normal appearance  Chaperone: Montez Hageman present during the examination  ASSESSMENT:  75 y.o. [id number] here for annual gynecologic exam  PLAN:   1. Postmenopausal. S/p TVH in 1990. Using compounded vaginal estrogen cream. Refill was provided 11/2019 so she should have supply but will call us if she needs more. Aware of the risk of systemic absorption to include thrombotic diseases such as heart attack, stroke, DVT, PE and the breast cancer issue. She would like to continue with the vaginal estrogen cream. 2. Lichen sclerosus. Has been closely followed in the past with repeat examinations after using clobetasol cream, overall having good control of her symptoms and requests a refill, refill for clobetasol 0.05% cream to apply to the external vulva is sent to the pharmacy. 3. Pap smear 06/2017 normal.  No significant history of abnormal Pap smears. Discussed the screening guidelines, and she was not comfortable stopping screening yet so we went ahead  and collected a vaginal Pap smear for cytology only. 4. Osteopenia. DEXA 07/2018 T score -2.2, slight loss in spine compared to 2018.  Continue weight bearing exercise and calcium + vitamin D and I recommended to repeat DEXA this year, so she will plan to schedule. The patient is aware that I will only be at this practice until early March 2022 so she knows to make sure she requests follow-up on  results for any medical tests that she does when I am no longer at the practice. 5. Colonoscopy 2019. Recommended that she continue per the prescribed interval.  6. Mammogram 06/2020. Will continue with annual mammography. Breast exam normal today. 7. Health maintenance.  No lab work as she has this completed elsewhere.  Return annually or sooner, prn.  Joseph Pierini MD  07/26/20

## 2020-07-31 LAB — PAP IG W/ RFLX HPV ASCU

## 2020-09-04 ENCOUNTER — Ambulatory Visit (INDEPENDENT_AMBULATORY_CARE_PROVIDER_SITE_OTHER): Payer: Medicare HMO

## 2020-09-04 ENCOUNTER — Other Ambulatory Visit: Payer: Self-pay

## 2020-09-04 ENCOUNTER — Other Ambulatory Visit: Payer: Self-pay | Admitting: Obstetrics and Gynecology

## 2020-09-04 DIAGNOSIS — Z780 Asymptomatic menopausal state: Secondary | ICD-10-CM

## 2020-09-04 DIAGNOSIS — M8580 Other specified disorders of bone density and structure, unspecified site: Secondary | ICD-10-CM

## 2020-09-04 DIAGNOSIS — Z01419 Encounter for gynecological examination (general) (routine) without abnormal findings: Secondary | ICD-10-CM

## 2020-09-04 DIAGNOSIS — M810 Age-related osteoporosis without current pathological fracture: Secondary | ICD-10-CM | POA: Diagnosis not present

## 2020-10-01 ENCOUNTER — Ambulatory Visit: Payer: Medicare HMO | Admitting: Obstetrics & Gynecology

## 2020-10-01 ENCOUNTER — Encounter: Payer: Self-pay | Admitting: Obstetrics & Gynecology

## 2020-10-01 ENCOUNTER — Other Ambulatory Visit: Payer: Self-pay

## 2020-10-01 VITALS — BP 116/70

## 2020-10-01 DIAGNOSIS — M816 Localized osteoporosis [Lequesne]: Secondary | ICD-10-CM | POA: Diagnosis not present

## 2020-10-01 NOTE — Progress Notes (Signed)
    Carla Alvarado [birth year] [id number]        75 y.o.  [id number]   RP: Counseling on management of Osteoporosis  HPI: Last BD 09/04/2020 with Osteoporosis T-Score -2.5 at the Left Femoral Neck.  Patient just started on Vit D supplement 2000 IU daily.  Likes yogurt.  No regular weight bearing physical activity, but willing to start.  No h/o fragility fracture.   OB History  Gravida Para Term Preterm AB Living  4 2 2   2 2   SAB IAB Ectopic Multiple Live Births  2            # Outcome Date GA Lbr Len/2nd Weight Sex Delivery Anes PTL Lv  4 Term           3 Term           2 SAB           1 SAB             Past medical history,surgical history, problem list, medications, allergies, family history and social history were all reviewed and documented in the EPIC chart.   Directed ROS with pertinent positives and negatives documented in the history of present illness/assessment and plan.  Exam:  Vitals:   10/01/20 1047  BP: 116/70   General appearance:  Normal  Bone Density 09/04/2020:  Osteoporosis with T-Score -2.5 at the Left Femoral Neck.  Other sites Osteopenia.   Assessment/Plan:  75 y.o. [id number]   1. Localized osteoporosis without current pathological fracture New diagnosis of osteoporosis on last bone density September 04, 2020 showing a T score of -2.5 at the left femoral neck.  Other sites with osteopenia.  Patient has not shown a significant decrease in bone density at the 3 sites measured since 2020.  Planning to increase weightbearing physical activity with walking, jogging and weightlifting.  Just started on vitamin D supplements 2000 international unit daily.  We will measure vitamin D level today.  Enjoys yogurt.  Will calculate how much nutritional calcium she is getting and complete with a supplement to reach 1.5 g/day.  With those changes, decision not to start on a bone medication at this time.  We will repeat a bone density at 1 to 2 years depending on how she is  doing with those measures. - Vitamin D 1,25 dihydroxy  Princess Bruins MD, 11:24 AM 10/01/2020

## 2020-10-04 LAB — VITAMIN D 1,25 DIHYDROXY
Vitamin D 1, 25 (OH)2 Total: 37 pg/mL (ref 18–72)
Vitamin D2 1, 25 (OH)2: <8 pg/mL
Vitamin D3 1, 25 (OH)2: 37 pg/mL

## 2020-11-26 ENCOUNTER — Emergency Department (HOSPITAL_BASED_OUTPATIENT_CLINIC_OR_DEPARTMENT_OTHER)
Admission: EM | Admit: 2020-11-26 | Discharge: 2020-11-27 | Disposition: A | Discharge status: Home / Self Care | Payer: Medicare HMO | Attending: Emergency Medicine | Admitting: Emergency Medicine

## 2020-11-26 ENCOUNTER — Encounter (HOSPITAL_BASED_OUTPATIENT_CLINIC_OR_DEPARTMENT_OTHER): Payer: Self-pay | Admitting: *Deleted

## 2020-11-26 ENCOUNTER — Other Ambulatory Visit: Payer: Self-pay

## 2020-11-26 ENCOUNTER — Emergency Department (HOSPITAL_BASED_OUTPATIENT_CLINIC_OR_DEPARTMENT_OTHER): Payer: Medicare HMO

## 2020-11-26 DIAGNOSIS — Z85828 Personal history of other malignant neoplasm of skin: Secondary | ICD-10-CM | POA: Insufficient documentation

## 2020-11-26 DIAGNOSIS — R1084 Generalized abdominal pain: Secondary | ICD-10-CM

## 2020-11-26 DIAGNOSIS — N83202 Unspecified ovarian cyst, left side: Secondary | ICD-10-CM | POA: Insufficient documentation

## 2020-11-26 DIAGNOSIS — Z79899 Other long term (current) drug therapy: Secondary | ICD-10-CM | POA: Diagnosis not present

## 2020-11-26 DIAGNOSIS — E039 Hypothyroidism, unspecified: Secondary | ICD-10-CM | POA: Insufficient documentation

## 2020-11-26 LAB — CBC
HCT: 38.3 % (ref 36.0–46.0)
Hemoglobin: 13.1 g/dL (ref 12.0–15.0)
MCH: 32.5 pg (ref 26.0–34.0)
MCHC: 34.2 g/dL (ref 30.0–36.0)
MCV: 95 fL (ref 80.0–100.0)
Platelets: 323 10*3/uL (ref 150–400)
RBC: 4.03 MIL/uL (ref 3.87–5.11)
RDW: 11.5 % (ref 11.5–15.5)
WBC: 6.9 10*3/uL (ref 4.0–10.5)
nRBC: 0 % (ref 0.0–0.2)

## 2020-11-26 LAB — HEPATIC FUNCTION PANEL
ALT: 17 U/L (ref 0–44)
AST: 21 U/L (ref 15–41)
Albumin: 4.5 g/dL (ref 3.5–5.0)
Alkaline Phosphatase: 72 U/L (ref 38–126)
Bilirubin, Direct: 0.1 mg/dL (ref 0.0–0.2)
Indirect Bilirubin: 0.5 mg/dL (ref 0.3–0.9)
Total Bilirubin: 0.6 mg/dL (ref 0.3–1.2)
Total Protein: 7.7 g/dL (ref 6.5–8.1)

## 2020-11-26 LAB — BASIC METABOLIC PANEL
Anion gap: 8 (ref 5–15)
BUN: 17 mg/dL (ref 8–23)
CO2: 27 mmol/L (ref 22–32)
Calcium: 9.3 mg/dL (ref 8.9–10.3)
Chloride: 103 mmol/L (ref 98–111)
Creatinine, Ser: 0.77 mg/dL (ref 0.44–1.00)
GFR, Estimated: >60 mL/min (ref >60)
Glucose, Bld: 110 mg/dL — ABNORMAL HIGH (ref 70–99)
Potassium: 4.2 mmol/L (ref 3.5–5.1)
Sodium: 138 mmol/L (ref 135–145)

## 2020-11-26 LAB — TROPONIN I (HIGH SENSITIVITY)
Troponin I (High Sensitivity): <2 ng/L (ref <18)
Troponin I (High Sensitivity): <2 ng/L (ref <18)

## 2020-11-26 LAB — LIPASE, BLOOD: Lipase: 32 U/L (ref 11–51)

## 2020-11-26 MED ORDER — SODIUM CHLORIDE 0.9 % IV BOLUS
1000.0000 mL | Freq: Once | INTRAVENOUS | Status: AC
  Administered 2020-11-26: 1000 mL via INTRAVENOUS

## 2020-11-26 MED ORDER — SUCRALFATE 1 GM/10ML PO SUSP: 1.0000 g | Freq: Three times a day (TID) | ORAL | 0 refills | Status: DC

## 2020-11-26 MED ORDER — PANTOPRAZOLE SODIUM 40 MG PO TBEC: 40.0000 mg | DELAYED_RELEASE_TABLET | Freq: Every day | ORAL | 0 refills | Status: DC

## 2020-11-26 MED ORDER — FENTANYL CITRATE (PF) 100 MCG/2ML IJ SOLN
50.0000 ug | Freq: Once | INTRAMUSCULAR | Status: AC
Start: 2020-11-26 — End: 2020-11-26
  Administered 2020-11-26: 50 ug via INTRAVENOUS
  Filled 2020-11-26: qty 2

## 2020-11-26 MED ORDER — IOHEXOL 300 MG/ML  SOLN
75.0000 mL | Freq: Once | INTRAMUSCULAR | Status: AC | PRN
  Administered 2020-11-26: 75 mL via INTRAVENOUS

## 2020-11-26 MED ORDER — PANTOPRAZOLE SODIUM 40 MG PO TBEC
40.0000 mg | DELAYED_RELEASE_TABLET | Freq: Once | ORAL | Status: AC
  Administered 2020-11-27: 40 mg via ORAL
  Filled 2020-11-26: qty 1

## 2020-11-26 NOTE — ED Provider Notes (Signed)
[facility] Provider Note   CSN: [id number] Arrival date & time: 11/26/20  2005     History Chief Complaint  Patient presents with  . Chest Pain    Carla Alvarado is a 75 y.o. female.  HPI 75 year old female presents with a chief complaint of abdominal pain.  Started last night.  Started off mostly upper but now is pretty much diffuse.  Sometimes gets some discomfort in her chest.  Feels like a gas pain.  Pain waxes and wanes and seems to come and go.  Food did not seem to make it worse today.  No vomiting, diarrhea.  There is some low back discomfort.  No shortness of breath.  Rates it as about a 4 out of 10 currently.  Took some antacids which did not help too much but then took some Pepto-Bismol which partially helped last night.  Past Medical History:  Diagnosis Date  . Allergy   . Anemia   . Anxiety   . Arthritis   . Cancer ([place])    Melanoma  . Depression   . Family history of breast cancer   . Family history of colon cancer   . Family history of Lynch syndrome   . Family history of prostate cancer   . GERD (gastroesophageal reflux disease)   . History of renal stone   . Hypercholesteremia   . Hypothyroid   . Lichen sclerosus   . Migraine   . Osteopenia 07/2018   T score -2.2 with slight loss at spine stable at the hips  . Personal history of malignant melanoma   . Pneumonia   . Skin cancer 07/2015  . Tubular adenoma of colon 01/2013    Patient Active Problem List   Diagnosis Date Noted  . Paresthesia 03/12/2020  . Genetic testing 04/09/2017  . Personal history of malignant melanoma   . Family history of breast cancer   . Family history of colon cancer   . Family history of prostate cancer   . Family history of Lynch syndrome   . Skin cancer 07/24/2015  . HLD (hyperlipidemia) 03/22/2015  . Adult hypothyroidism 03/22/2015  . Hypothyroid   . Hypercholesteremia   . Lichen sclerosus   . Osteopenia     Past Surgical  History:  Procedure Laterality Date  . BREAST EXCISIONAL BIOPSY Right   . breast papilloma    . CESAREAN SECTION    . MELANOMA EXCISION WITH SENTINEL LYMPH NODE BIOPSY    . VAGINAL HYSTERECTOMY  1990     OB History    Gravida  4   Para  2   Term  2   Preterm      AB  2   Living  2     SAB  2   IAB      Ectopic      Multiple      Live Births              Family History  Problem Relation Age of Onset  . Hypertension Mother   . Colon cancer Mother        mets from breast cancer  . Heart disease Mother   . Breast cancer Mother        Age 76, mets all over   . Heart disease Brother   . Diabetes Maternal Grandmother   . Heart disease Maternal Grandfather   . Breast cancer Maternal Aunt 79  . Breast cancer Paternal Aunt  Age 76  . Breast cancer Maternal Aunt        Age 43  . Breast cancer Sister 68       maternal half sister  . Breast cancer Other        Lynch syndrome  . Prostate cancer Cousin        maternal first cousin  . Colon cancer Cousin        maternal first cousin  . Cancer Cousin        NOS - maternal first cousin  . Breast cancer Cousin 76       TN breast cancer, maternal first cousin  . Esophageal cancer Neg Hx   . Rectal cancer Neg Hx   . Stomach cancer Neg Hx     Social History   Tobacco Use  . Smoking status: Never Smoker  . Smokeless tobacco: Never Used  Vaping Use  . Vaping Use: Never used  Substance Use Topics  . Alcohol use: Yes    Alcohol/week: 0.0 standard drinks    Comment: occassionally  . Drug use: No    Home Medications Prior to Admission medications   Medication Sig Start Date End Date Taking? Authorizing Provider  pantoprazole (PROTONIX) 40 MG tablet Take 1 tablet (40 mg total) by mouth daily. 11/26/20  Yes Sherwood Gambler, MD  clobetasol cream (TEMOVATE) 0.05 % apply externally to external vulvar area at bedtime 07/26/20   Joseph Pierini, MD  diazepam (VALIUM) 2 MG tablet Take 2 mg by mouth at bedtime  as needed for anxiety.    [provider]  levothyroxine (SYNTHROID, LEVOTHROID) 88 MCG tablet Take 88 mcg by mouth daily before breakfast.    [provider]  LOVASTATIN PO Take 40 mg by mouth.    [provider]  MAGNESIUM GLYCINATE PO Take 200 mg by mouth daily.    [provider]  Multiple Vitamin (MULTIVITAMIN PO) Take by mouth.    [provider]  NONFORMULARY OR COMPOUNDED ITEM Estradiol vaginal cream 0.02% insert twice weekly 12/14/19   Joseph Pierini, MD  OVER THE COUNTER MEDICATION Calcium 600 mg, one capsule daily.    [provider]  OVER THE COUNTER MEDICATION Vitamin D 3, one capsule daily.    [provider]  OVER THE COUNTER MEDICATION Vitamin B 12, 2000 mcg. One tablet daily.    [provider]  SUMAtriptan (IMITREX) 100 MG tablet Take 100 mg by mouth every 2 (two) hours as needed for migraine. May repeat in 2 hours if headache persists or recurs.    [provider]  zolpidem (AMBIEN) 5 MG tablet Take 5 mg by mouth at bedtime as needed for sleep.    [provider]    Allergies    Morphine and related and Levothyroxine sodium  Review of Systems   Review of Systems  Constitutional: Negative for fever.  Respiratory: Negative for shortness of breath.   Cardiovascular: Positive for chest pain.  Gastrointestinal: Positive for abdominal pain. Negative for diarrhea and vomiting.  Musculoskeletal: Positive for back pain.  All other systems reviewed and are negative.   Physical Exam Updated Vital Signs BP (!) 145/70   Pulse 83   Temp 98.7 F (37.1 C) (Oral)   Resp 17   Ht 4\' 11"  (1.499 m)   Wt 49 kg   SpO2 100%   BMI 21.81 kg/m   Physical Exam Vitals and nursing note reviewed.  Constitutional:      General: She is not in  acute distress.    Appearance: She is well-developed. She is not ill-appearing or diaphoretic.  HENT:     Head: Normocephalic and atraumatic.     Right  Ear: External ear normal.     Left Ear: External ear normal.     Nose: Nose normal.  Eyes:     General:        Right eye: No discharge.        Left eye: No discharge.  Cardiovascular:     Rate and Rhythm: Normal rate and regular rhythm.     Heart sounds: Normal heart sounds.  Pulmonary:     Effort: Pulmonary effort is normal.     Breath sounds: Normal breath sounds.  Abdominal:     Palpations: Abdomen is soft.     Tenderness: There is generalized abdominal tenderness. There is no right CVA tenderness or left CVA tenderness.  Skin:    General: Skin is warm and dry.  Neurological:     Mental Status: She is alert.  Psychiatric:        Mood and Affect: Mood is not anxious.     ED Results / Procedures / Treatments   Labs (all labs ordered are listed, but only abnormal results are displayed) Labs Reviewed  BASIC METABOLIC PANEL - Abnormal; Notable for the following components:      Result Value   Glucose, Bld 110 (*)    All other components within normal limits  CBC  LIPASE, BLOOD  HEPATIC FUNCTION PANEL  URINALYSIS, ROUTINE W REFLEX MICROSCOPIC  TROPONIN I (HIGH SENSITIVITY)  TROPONIN I (HIGH SENSITIVITY)    EKG EKG Interpretation  Date/Time:  Tuesday November 26 2020 20:19:55 EDT Ventricular Rate:  77 PR Interval:  140 QRS Duration: 82 QT Interval:  364 QTC Calculation: 411 R Axis:   73 Text Interpretation: Normal sinus rhythm no acute ST/T changes similar to 2018 Confirmed by Sherwood Gambler [phone]) on 11/26/2020 9:46:23 PM   Radiology No results found.  Procedures Procedures   Medications Ordered in ED Medications  pantoprazole (PROTONIX) EC tablet 40 mg (has no administration in time range)  sodium chloride 0.9 % bolus 1,000 mL (1,000 mLs Intravenous New Bag/Given 11/26/20 2234)  fentaNYL (SUBLIMAZE) injection 50 mcg (50 mcg Intravenous Given 11/26/20 2235)  iohexol (OMNIPAQUE) 300 MG/ML solution 75 mL (75 mLs Intravenous Contrast Given 11/26/20 2257)    ED  Course  I have reviewed the triage vital signs and the nursing notes.  Pertinent labs & imaging results that were available during my care of the patient were reviewed by me and considered in my medical decision making (see chart for details).    MDM Rules/Calculators/A&P                          Patient with nonspecific abdominal pain.  CT is reassuring besides the ovarian cyst.  We discussed how she will need this followed up.  Otherwise her symptoms are probably from gastritis.  We will put on PPI.  We will also give Carafate.  Return precautions discussed. Final Clinical Impression(s) / ED Diagnoses Final diagnoses:  Generalized abdominal pain  Left ovarian cyst    Rx / DC Orders ED Discharge Orders         Ordered    pantoprazole (PROTONIX) 40 MG tablet  Daily        11/26/20 2347           Sherwood Gambler, MD 11/26/20 2349

## 2020-11-26 NOTE — Discharge Instructions (Signed)
If you develop worsening, continued, or recurrent abdominal pain, uncontrolled vomiting, fever, chest or back pain, or any other new/concerning symptoms then return to the ER for evaluation.   You will need to follow-up with your family doctor for an ultrasound in 6-12 months for this ovarian cyst seen.

## 2020-11-26 NOTE — ED Triage Notes (Addendum)
C/o mid sternal chest pain, SOB and diaphoretic  x 2 days , pt reports increased stress

## 2020-11-27 LAB — URINALYSIS, ROUTINE W REFLEX MICROSCOPIC
Bilirubin Urine: NEGATIVE
Glucose, UA: NEGATIVE mg/dL
Hgb urine dipstick: NEGATIVE
Ketones, ur: NEGATIVE mg/dL
Leukocytes,Ua: NEGATIVE
Nitrite: NEGATIVE
Protein, ur: NEGATIVE mg/dL
Specific Gravity, Urine: <1.005 — ABNORMAL LOW (ref 1.005–1.030)
pH: 6 (ref 5.0–8.0)

## 2021-05-22 ENCOUNTER — Telehealth: Payer: Self-pay

## 2021-05-22 DIAGNOSIS — N83202 Unspecified ovarian cyst, left side: Secondary | ICD-10-CM

## 2021-05-22 NOTE — Telephone Encounter (Signed)
Patient was seen in ER 11/26/2020 for pain and on CT scan a L Ov Cyst was diagnosed. Follow up was recommended.  IMPRESSION: 1. A 3 cm left ovarian cystic lesion. Recommend follow-up US in 6-12 months. Note: This recommendation does not apply to premenarchal patients and to those with increased risk (genetic, family history, elevated tumor markers or other high-risk factors) of ovarian cancer. Reference: JACR 2020 Feb; 17(2):248-254.  Ok to place order for u/s for patient to return to office for follow up?

## 2021-05-28 NOTE — Telephone Encounter (Signed)
Dr. Marguerita Merles replied in routing comment " Yes, schedule Pelvic US.    I sent message to appt desk to call her to schedule. Order placed.

## 2021-05-30 ENCOUNTER — Other Ambulatory Visit: Payer: Self-pay | Admitting: Obstetrics & Gynecology

## 2021-05-30 DIAGNOSIS — Z1231 Encounter for screening mammogram for malignant neoplasm of breast: Secondary | ICD-10-CM

## 2021-07-03 ENCOUNTER — Ambulatory Visit (INDEPENDENT_AMBULATORY_CARE_PROVIDER_SITE_OTHER): Payer: Medicare HMO

## 2021-07-03 ENCOUNTER — Ambulatory Visit (INDEPENDENT_AMBULATORY_CARE_PROVIDER_SITE_OTHER): Payer: Medicare HMO | Admitting: Obstetrics & Gynecology

## 2021-07-03 ENCOUNTER — Encounter: Payer: Self-pay | Admitting: Obstetrics & Gynecology

## 2021-07-03 ENCOUNTER — Other Ambulatory Visit: Payer: Self-pay

## 2021-07-03 VITALS — BP 124/64 | HR 72 | Resp 20

## 2021-07-03 DIAGNOSIS — M816 Localized osteoporosis [Lequesne]: Secondary | ICD-10-CM | POA: Diagnosis not present

## 2021-07-03 DIAGNOSIS — N83202 Unspecified ovarian cyst, left side: Secondary | ICD-10-CM | POA: Diagnosis not present

## 2021-07-03 MED ORDER — NONFORMULARY OR COMPOUNDED ITEM: 4 refills | Status: DC

## 2021-07-03 MED ORDER — ALENDRONATE SODIUM 70 MG PO TABS: 70.0000 mg | ORAL_TABLET | ORAL | 4 refills | Status: DC

## 2021-07-03 NOTE — Progress Notes (Signed)
° ° °  Carla Alvarado Nov 10, 1945 [id number]        75 y.o.  [id number]   RP: Left Ovarian Cyst on CT scan for Pelvic US   HPI: Left Ovarian Cyst 3.1 cm detected on CT scan in 11/2020.  No pelvic pain.  No PMB.   BD 08/2020 with Osteoporosis, T-Score -2.5 at the Lt Femoral Neck.  Not able to walk and exercise as much as she was planning as she is caring for her 62 yo son who has throat Cancer.   OB History  Gravida Para Term Preterm AB Living  4 2 2   2 2   SAB IAB Ectopic Multiple Live Births  2            # Outcome Date GA Lbr Len/2nd Weight Sex Delivery Anes PTL Lv  4 Term           3 Term           2 SAB           1 SAB             Past medical history,surgical history, problem list, medications, allergies, family history and social history were all reviewed and documented in the EPIC chart.   Directed ROS with pertinent positives and negatives documented in the history of present illness/assessment and plan.  Exam:  Vitals:   07/03/21 1122  BP: 124/64  Pulse: 72  Resp: 20  SpO2: 96%   General appearance:  Normal  Pelvic US today: T/V images.  Vaginal cuff normal.  Uterus surgically absent.  Right ovary is atrophic in appearance with a single remnant follicle measured at 1.2 cm.  Left ovary with a multicystic appearance.  The cyst is avascular and measures 2.1 x 1.3 cm.  Positive perfusion to both ovaries.  No free fluid in the pelvis.  CT scan 11/2020: Reproductive: Status post hysterectomy. No right adnexal lesion. The right ovary is grossly unremarkable. There is a 3.1cm cystic lesion within the left ovary.   Assessment/Plan:  76 y.o. [id number]   1. Left ovarian cyst Left Ovarian Cyst 3.1 cm detected on CT scan in 11/2020.  No pelvic pain.  No PMB.  Pelvic US findings thoroughly reviewed with patient.  Reassured that the Left Ovarian cyst is now decreased in size to 2.1 cm and is avascular.  No FF in the pelvis.  Will complete the investigation with a Ca 125.  Repeat a  Pelvic US in 6 months to confirm stability.  Findings c/w a benign left ovarian cyst. Patient agrees with the plan. - CA 125 - US Transvaginal Non-OB; Future in 6 months  2. Localized osteoporosis without current pathological fracture BD 08/2020 with Osteoporosis, T-Score -2.5 at the Lt Femoral Neck.  Not able to walk and exercise as much as she was planning as she is caring for her 78 yo son who has throat Cancer.  Decision to start on Fosamax.  Risks/Benefits reviewed and prescription sent to pharmacy.  Other orders - NONFORMULARY OR COMPOUNDED ITEM; Estradiol vaginal cream 0.02% insert twice weekly - alendronate (FOSAMAX) 70 MG tablet; Take 1 tablet (70 mg total) by mouth every 7 (seven) days. Take with a full glass of water on an empty stomach.   Princess Bruins MD, 11:37 AM 07/03/2021

## 2021-07-04 ENCOUNTER — Encounter: Payer: Self-pay | Admitting: Obstetrics & Gynecology

## 2021-07-04 LAB — CA 125: CA 125: 4 U/mL (ref <35)

## 2021-07-17 ENCOUNTER — Other Ambulatory Visit: Payer: Self-pay | Admitting: *Deleted

## 2021-07-17 MED ORDER — NONFORMULARY OR COMPOUNDED ITEM: 4 refills | Status: DC

## 2021-07-17 NOTE — Telephone Encounter (Signed)
[facility] sent fax requesting refill on compound estradiol vaginal cream. Rx was prescribed on 07/03/21,however set on phone in and not phoned in. I called [facility] and left on pharmacy voicemail Rx.

## 2021-07-22 ENCOUNTER — Ambulatory Visit
Admission: RE | Admit: 2021-07-22 | Discharge: 2021-07-22 | Disposition: A | Discharge status: Home / Self Care | Payer: Medicare HMO | Source: Ambulatory Visit | Attending: Obstetrics & Gynecology | Admitting: Obstetrics & Gynecology

## 2021-07-22 DIAGNOSIS — Z1231 Encounter for screening mammogram for malignant neoplasm of breast: Secondary | ICD-10-CM

## 2021-07-23 ENCOUNTER — Telehealth: Payer: Self-pay | Admitting: *Deleted

## 2021-07-23 DIAGNOSIS — R928 Other abnormal and inconclusive findings on diagnostic imaging of breast: Secondary | ICD-10-CM

## 2021-07-23 NOTE — Telephone Encounter (Signed)
Patient scheduled on 08/08/21 for diag. Mammogram and left breast ultrasound.

## 2021-07-23 NOTE — Telephone Encounter (Signed)
Patient called receive abnormal mammogram done yesterday. Patient was ready to schedule. I told her I could placed the orders for additional imaging and she can call to schedule. Patient aware to call daily/weekly to check for any cancellation if appointment is booked out far.

## 2021-08-04 ENCOUNTER — Telehealth: Payer: Self-pay | Admitting: *Deleted

## 2021-08-04 NOTE — Telephone Encounter (Signed)
Patient called took her 4th pills on Fosamax 70 mg tablet reports feeling achy all over. Patient reports she had to take a aleve which helped. She reports is worse in am when getting out of bed, she took a covid test just to make sure due to symptoms and it was negative. She asked if this was a normal symptom and will it get better with time? Please advise

## 2021-08-08 ENCOUNTER — Ambulatory Visit
Admission: RE | Admit: 2021-08-08 | Discharge: 2021-08-08 | Disposition: A | Discharge status: Home / Self Care | Payer: Medicare HMO | Source: Ambulatory Visit | Attending: Obstetrics & Gynecology | Admitting: Obstetrics & Gynecology

## 2021-08-08 ENCOUNTER — Ambulatory Visit: Admission: RE | Admit: 2021-08-08 | Payer: Medicare HMO | Source: Ambulatory Visit

## 2021-08-08 DIAGNOSIS — R928 Other abnormal and inconclusive findings on diagnostic imaging of breast: Secondary | ICD-10-CM

## 2021-08-25 NOTE — Telephone Encounter (Signed)
Dr.Lavoie replied "Recommend requesting to switch to Prolia treatment based on side effects. " ? ? ?Patient aware, she has annual exam scheduled on 08/27/21 she will speak with [facility] about this.  ?

## 2021-08-27 ENCOUNTER — Ambulatory Visit (INDEPENDENT_AMBULATORY_CARE_PROVIDER_SITE_OTHER): Payer: Medicare HMO | Admitting: Obstetrics & Gynecology

## 2021-08-27 ENCOUNTER — Encounter: Payer: Self-pay | Admitting: Obstetrics & Gynecology

## 2021-08-27 ENCOUNTER — Other Ambulatory Visit: Payer: Self-pay

## 2021-08-27 VITALS — BP 116/78 | HR 68 | Resp 16 | Ht <= 58 in | Wt 107.0 lb

## 2021-08-27 DIAGNOSIS — Z780 Asymptomatic menopausal state: Secondary | ICD-10-CM

## 2021-08-27 DIAGNOSIS — Z01419 Encounter for gynecological examination (general) (routine) without abnormal findings: Secondary | ICD-10-CM

## 2021-08-27 DIAGNOSIS — N83202 Unspecified ovarian cyst, left side: Secondary | ICD-10-CM | POA: Diagnosis not present

## 2021-08-27 DIAGNOSIS — M816 Localized osteoporosis [Lequesne]: Secondary | ICD-10-CM | POA: Diagnosis not present

## 2021-08-27 NOTE — Progress Notes (Signed)
? ? ?Carla Alvarado 05-30-1946 [id number] ? ? ?History:    76 y.o. [id number] ? ?RP:  Established patient presenting for annual gyn exam  ? ?HPI:  Postmenopause, well on no HRT.  S/P Total Hysterectomy.  Pap Neg 07/2020.  Breasts normal.  Mammo 07/2021.  Left Dx mammo/US Negative.  Last BD 09/04/2020 with Osteoporosis T-Score -2.5 at the Left Femoral Neck.  Started on Fosamax. Had joint pains, now better. Vit D supplement 2000 IU daily, Ca++.  Walking regularly. BMI 22.56.  Health labs with Fam MD.  Harriet Masson 05-13-2018, every 5 yrs.  Mother died of Colon Ca. ? ? ?Past medical history,surgical history, family history and social history were all reviewed and documented in the EPIC chart. ? ?Gynecologic History ?No LMP recorded. Patient has had a hysterectomy. ? ?Obstetric History ?OB History  ?Gravida Para Term Preterm AB Living  ?'4 2 2   2 2  '$ ?SAB IAB Ectopic Multiple Live Births  ?2          ?  ?# Outcome Date GA Lbr Len/2nd Weight Sex Delivery Anes PTL Lv  ?4 Term           ?3 Term           ?2 SAB           ?1 SAB           ? ? ? ?ROS: A ROS was performed and pertinent positives and negatives are included in the history. ? GENERAL: No fevers or chills. HEENT: No change in vision, no earache, sore throat or sinus congestion. NECK: No pain or stiffness. CARDIOVASCULAR: No chest pain or pressure. No palpitations. PULMONARY: No shortness of breath, cough or wheeze. GASTROINTESTINAL: No abdominal pain, nausea, vomiting or diarrhea, melena or bright red blood per rectum. GENITOURINARY: No urinary frequency, urgency, hesitancy or dysuria. MUSCULOSKELETAL: No joint or muscle pain, no back pain, no recent trauma. DERMATOLOGIC: No rash, no itching, no lesions. ENDOCRINE: No polyuria, polydipsia, no heat or cold intolerance. No recent change in weight. HEMATOLOGICAL: No anemia or easy bruising or bleeding. NEUROLOGIC: No headache, seizures, numbness, tingling or weakness. PSYCHIATRIC: No depression, no loss of interest in normal  activity or change in sleep pattern.  ?  ? ?Exam: ? ? ?BP 116/78   Pulse 68   Resp 16   Ht 4' 9.75" (1.467 m)   Wt 107 lb (48.5 kg)   BMI 22.56 kg/m?  ? ?Body mass index is 22.56 kg/m?. ? ?General appearance : Well developed well nourished female. No acute distress ?HEENT: Eyes: no retinal hemorrhage or exudates,  Neck supple, trachea midline, no carotid bruits, no thyroidmegaly ?Lungs: Clear to auscultation, no rhonchi or wheezes, or rib retractions  ?Heart: Regular rate and rhythm, no murmurs or gallops ?Breast:Examined in sitting and supine position were symmetrical in appearance, no palpable masses or tenderness,  no skin retraction, no nipple inversion, no nipple discharge, no skin discoloration, no axillary or supraclavicular lymphadenopathy ?Abdomen: no palpable masses or tenderness, no rebound or guarding ?Extremities: no edema or skin discoloration or tenderness ? ?Pelvic: Vulva: Normal ?            Vagina: No gross lesions or discharge ? Cervix/Uterus absent ? Adnexa  Without masses or tenderness ? Anus: Normal ? ?Pelvic US 06/2021: Benign appearing avascular Lt ovarian cyst at 2.1 cm (decreased in size c/w the CT scan at 3 cm). ?Ca125 normal at 4 in 06/2021. ? ? ?Assessment/Plan:  76 y.o. female for  annual exam  ? ?1. Well female exam with routine gynecological exam ?Postmenopause, well on no HRT.  S/P Total Hysterectomy.  Pap Neg 07/2020.  Breasts normal.  Mammo 07/2021.  Left Dx mammo/US Negative.  Last BD 09/04/2020 with Osteoporosis T-Score -2.5 at the Left Femoral Neck.  Started on Fosamax. Had joint pains, now better. Vit D supplement 2000 IU daily, Ca++.  Walking regularly. BMI 22.56.  Health labs with Fam MD.  Harriet Masson 2018-05-08, every 5 yrs.  Mother died of Colon Ca. ? ?2. Postmenopause ?Postmenopause, well on no HRT.  S/P Total Hysterectomy.  ? ?3. Localized osteoporosis without current pathological fracture ?Last BD 09/04/2020 with Osteoporosis T-Score -2.5 at the Left Femoral Neck.  Started on  Fosamax. Had joint pains, now better. Vit D supplement 2000 IU daily, Ca++.  Walking regularly.  ? ?4. Left ovarian cyst ?Normal gynecologic exam s/p total hysterectomy today.  No pelvic pain.  Pelvic US 06/2021: Benign appearing avascular Lt ovarian cyst at 2.1 cm (decreased in size c/w the CT scan at 3 cm).  Ca125 normal at 4 in 06/2021.  Repeat Pelvic US in 12/2021 to confirm stability. ? ?Other orders ?- lovastatin (MEVACOR) 40 MG tablet; Take 40 mg by mouth daily.  ? ?Princess Bruins MD, 9:16 AM 08/27/2021 ? ?  ?

## 2021-11-06 ENCOUNTER — Telehealth: Payer: Self-pay | Admitting: *Deleted

## 2021-11-06 NOTE — Telephone Encounter (Signed)
Patient called stating she received recall letter in mail for repeat ultrasound she wanted to know why does she need this. I called patient back and informed her that per note on 08/27/21 repeat ultrasound order in 12/2021 to confirm stability, history of left ovarian cyst.  Message sent to appointments to schedule.

## 2021-11-08 IMAGING — CT CT ABD-PELV W/ CM
2 of 5 series · 15 of 46 positions shown, 17 images · IV contrast (Omnipaque)
Comparison: CT pelvis 08/21/2005.

CLINICAL DATA: Abdominal pain. Hx tubular adenoma of colon, renal
stone, GERD, vaginal hysterectomy, and cesarean section.

EXAM:
CT ABDOMEN AND PELVIS WITH CONTRAST
TECHNIQUE: Multidetector CT imaging of the abdomen and pelvis was performed
using the standard protocol following bolus administration of
intravenous contrast.
CONTRAST:  75mL OMNIPAQUE IOHEXOL 300 MG/ML  SOLN

[Series 2: axial st · axial · 0.80mm/px · z∈[+708,+1088]mm · 12 of 86 slices shown, 14 images]
[im 5/86  soft-tissue]
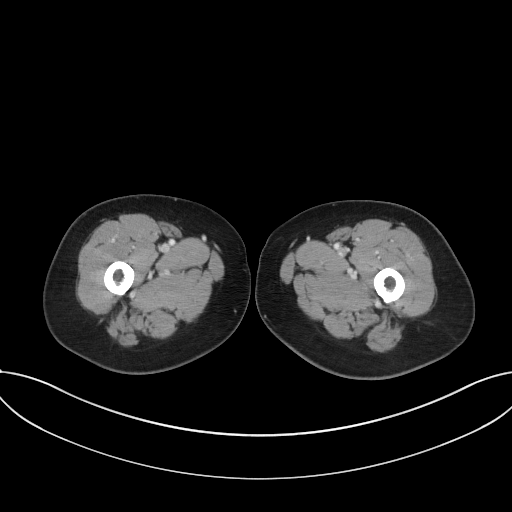
[im 5/86  bone]
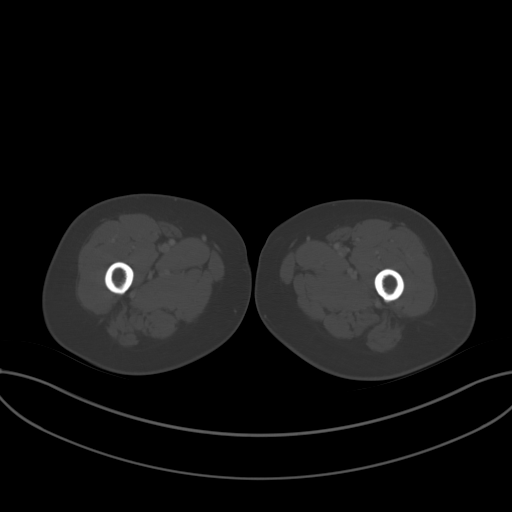
[im 14/86  soft-tissue]
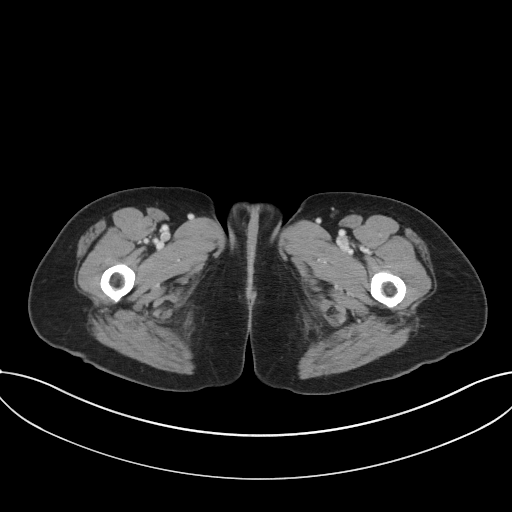
[im 18/86  soft-tissue]
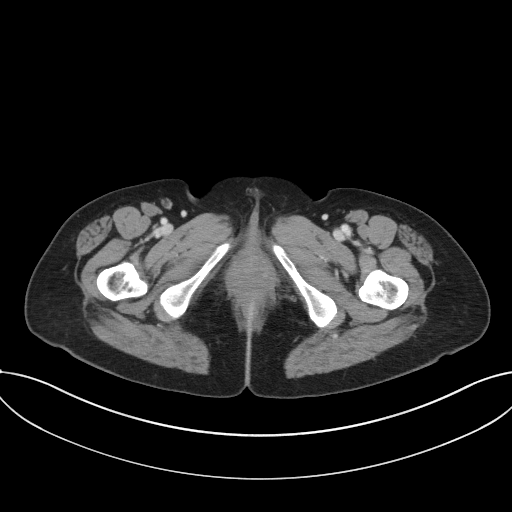
[im 27/86  soft-tissue]
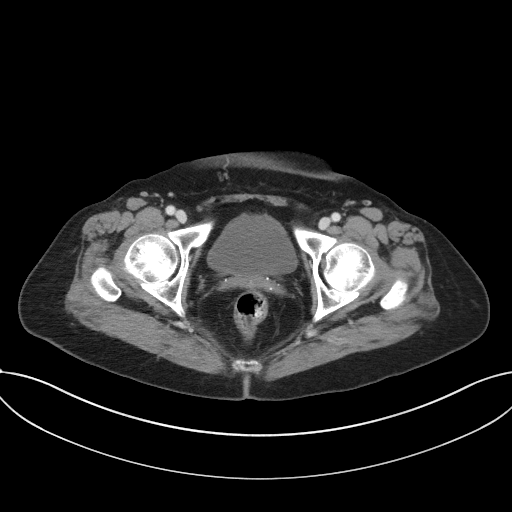
[im 32/86  soft-tissue]
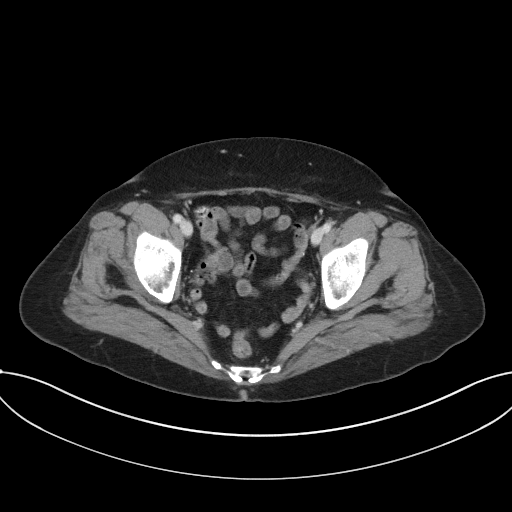
[im 41/86  soft-tissue]
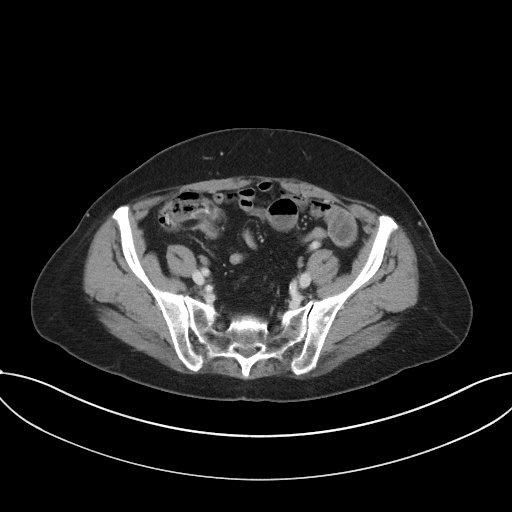
[im 45/86  soft-tissue]
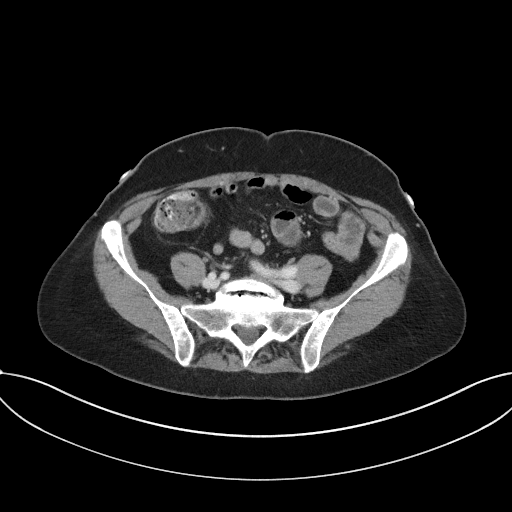
[im 54/86  soft-tissue]
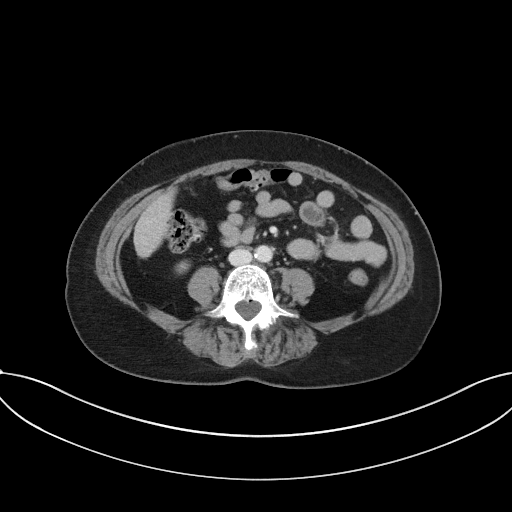
[im 59/86  soft-tissue]
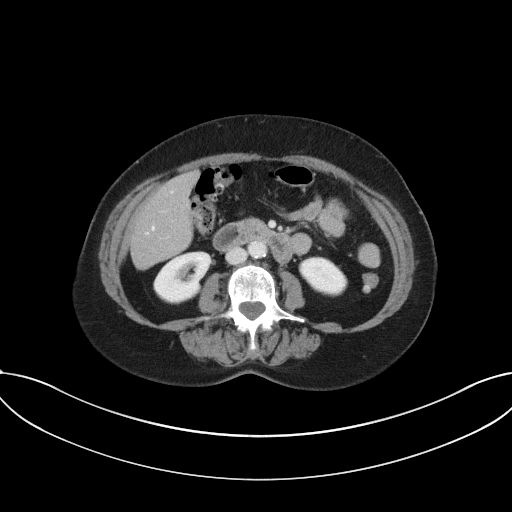
[im 59/86  bone]
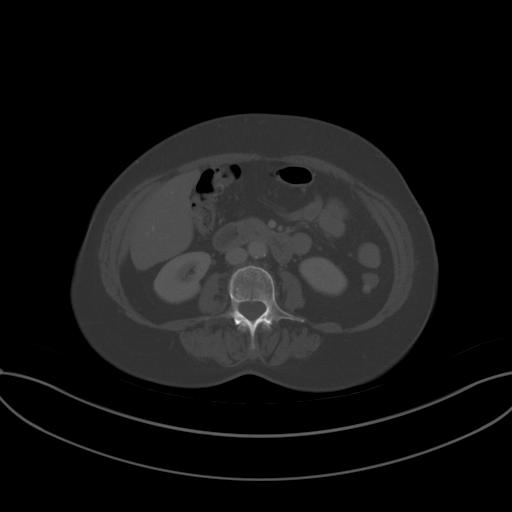
[im 68/86  soft-tissue]
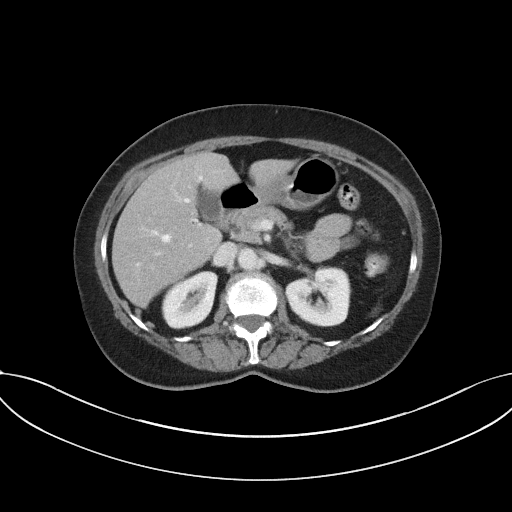
[im 72/86  soft-tissue]
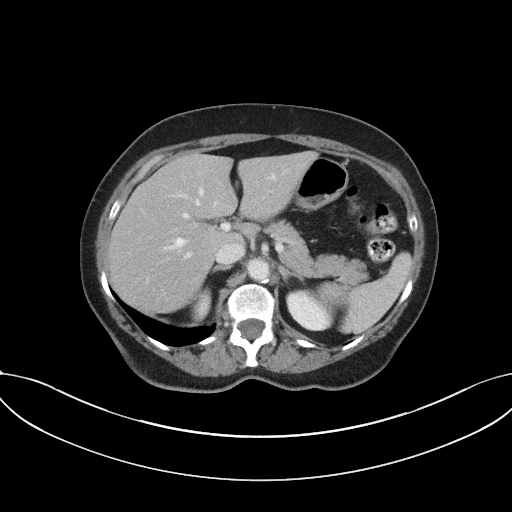
[im 81/86  soft-tissue]
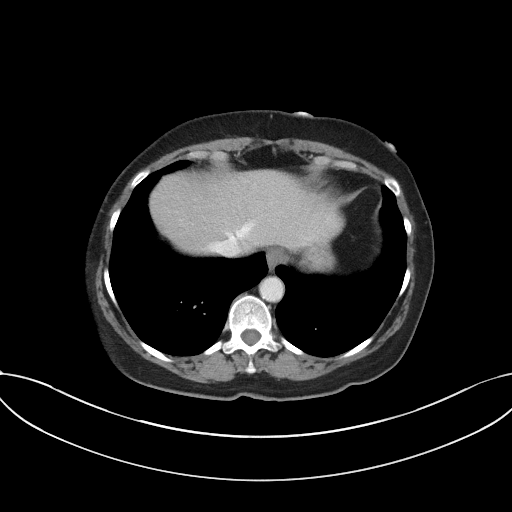

[Series 5: coronal st · coronal · 0.67mm/px · 3 of 74 slices shown]
[im 25/74  soft-tissue]
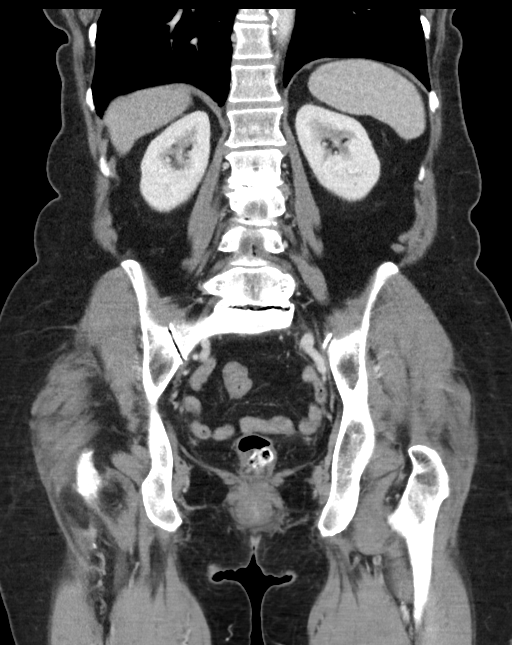
[im 33/74  soft-tissue]
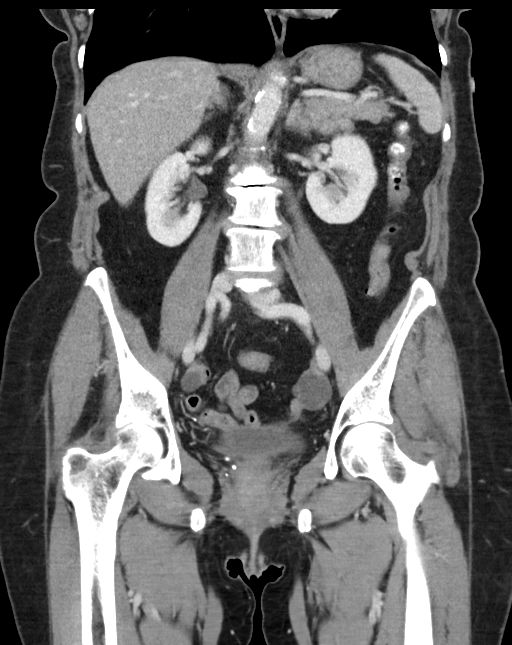
[im 41/74  soft-tissue]
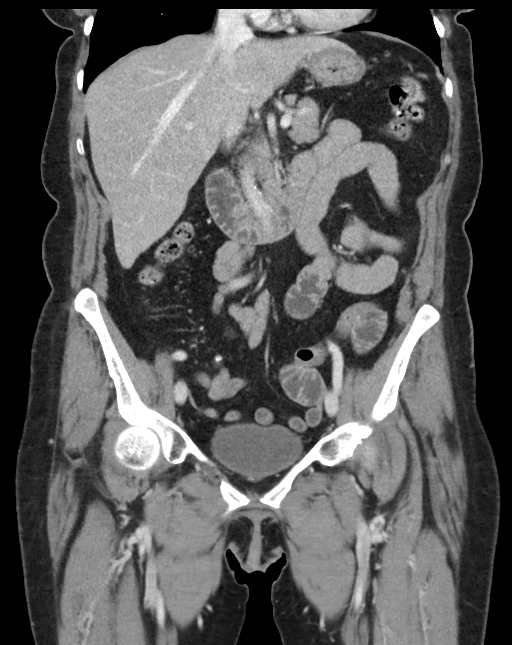

[15 of 46 positions shown; findings below may reference images not displayed]

FINDINGS: Lower chest: No acute abnormality.

Hepatobiliary: The hepatic parenchyma is diffusely mildly hypodense
compared to the splenic parenchyma consistent with fatty
infiltration. No focal liver abnormality. No gallstones, gallbladder
wall thickening, or pericholecystic fluid. No biliary dilatation.

Pancreas: No focal lesion. Normal pancreatic contour. No surrounding
inflammatory changes. No main pancreatic ductal dilatation.

Spleen: Normal in size without focal abnormality.

Adrenals/Urinary Tract:

No adrenal nodule bilaterally.

Bilateral kidneys enhance symmetrically. No hydronephrosis. No
hydroureter.

The urinary bladder is unremarkable.

Stomach/Bowel: Stomach is within normal limits. No evidence of bowel
wall thickening or dilatation. Scattered colonic diverticulosis.
Appendix appears normal.

Vascular/Lymphatic: No abdominal aorta or iliac aneurysm. At least
moderate atherosclerotic plaque of the aorta and its branches. No
abdominal, pelvic, or inguinal lymphadenopathy.

Reproductive: Status post hysterectomy. No right adnexal lesion. The
right ovary is grossly unremarkable ([DATE]). There is a 3.1cm cystic
lesion within the left ovarian lesion ([DATE].

Other: No intraperitoneal free fluid. No intraperitoneal free gas.
No organized fluid collection.

Musculoskeletal:

No abdominal wall hernia or abnormality.

Redemonstration of a slightly less sclerotic right iliac bone lesion
(chronic seen as far back as at least 2661). No suspicious lytic or
blastic osseous lesions. No acute displaced fracture. Multilevel
degenerative changes of the spine with intervertebral disc space
vacuum phenomenon and endplate sclerosis. Grade 1 anterolisthesis of
L5 on S1.
IMPRESSION: 1. A 3 cm left ovarian cystic lesion. Recommend follow-up US in 6-12
months. Note: This recommendation does not apply to premenarchal
patients and to those with increased risk (genetic, family history,
elevated tumor markers or other high-risk factors) of ovarian
cancer. Reference: JACR [DATE]):248-254.
2. Scattered colonic diverticulosis with no acute diverticulitis.
3.  Aortic Atherosclerosis (IGMGY-BCO.O).

## 2022-01-08 ENCOUNTER — Ambulatory Visit (INDEPENDENT_AMBULATORY_CARE_PROVIDER_SITE_OTHER): Payer: Medicare HMO | Admitting: Obstetrics & Gynecology

## 2022-01-08 ENCOUNTER — Telehealth: Payer: Self-pay | Admitting: *Deleted

## 2022-01-08 ENCOUNTER — Encounter: Payer: Self-pay | Admitting: Obstetrics & Gynecology

## 2022-01-08 ENCOUNTER — Ambulatory Visit: Payer: Medicare HMO

## 2022-01-08 VITALS — BP 110/70 | HR 61

## 2022-01-08 DIAGNOSIS — M816 Localized osteoporosis [Lequesne]: Secondary | ICD-10-CM

## 2022-01-08 DIAGNOSIS — N83202 Unspecified ovarian cyst, left side: Secondary | ICD-10-CM

## 2022-01-08 NOTE — Telephone Encounter (Signed)
Prolia insurance verification has been sent awaiting Summary of benefits  

## 2022-01-08 NOTE — Telephone Encounter (Signed)
-----   Message from Princess Bruins, MD sent at 01/08/2022 11:17 AM EDT ----- Regarding: Start on Prolia Osteoporosis.  Alendronate not tolerated, stopped 2 months ago because of severe bone pains and GERD.  Start on Prolia.

## 2022-01-08 NOTE — Progress Notes (Signed)
    Carla Alvarado [birth year] [id number]        76 y.o.  [id number]   RP: Left Ovarian Cyst for f/u Pelvic US  HPI: Small left Ovarian Cyst on last Pelvic US 06/2021.  No pelvic pain.  S/P Total Hysterectomy.  BD 08/2020 Osteoporosis Lt Femoral Neck T-Score -2.5.  Alendronate not tolerated, stopped 2 months ago d/t severe bone pains and GERD.   OB History  Gravida Para Term Preterm AB Living  '4 2 2   2 2  '$ SAB IAB Ectopic Multiple Live Births  2            # Outcome Date GA Lbr Len/2nd Weight Sex Delivery Anes PTL Lv  4 Term           3 Term           2 SAB           1 SAB             Past medical history,surgical history, problem list, medications, allergies, family history and social history were all reviewed and documented in the EPIC chart.   Directed ROS with pertinent positives and negatives documented in the history of present illness/assessment and plan.  Exam:  Vitals:   01/08/22 1102  BP: 110/70  Pulse: 61  SpO2: 99%   General appearance:  Normal  Pelvic US today: T/V images.  Comparison made to the pelvic ultrasound July 03, 2021.  Vaginal cuff is within normal limits with a small avascular calcification seen.  The uterus is surgically absent.  Right ovary is atrophic in appearance with a small avascular residual follicle again noted measured at 1.4 x 1.2 cm.  Previously measured at 1.2 x 1.2 cm, therefore no significant change.  Left ovary is multicystic in appearance with a 2.0 x 1.5 cm simple avascular cyst which was previously measured at 2.1 x 1.3 cm, therefore no significant change.  Positive perfusion to both ovaries.  No free fluid in the pelvis.   Assessment/Plan:  76 y.o. [id number]   1. Left ovarian cyst Small left Ovarian Cyst on last Pelvic US 06/2021.  No pelvic pain.  S/P Total Hysterectomy.  Pelvic US findings thoroughly reviewed with patient.  Left small simple avascular ovarian cyst is stable at 2.0 cm.  No FF in the pelvis.  Patient  reassured.  2. Localized osteoporosis without current pathological fracture  BD 08/2020 Osteoporosis Lt Femoral Neck T-Score -2.5.  Alendronate not tolerated, stopped 2 months ago d/t severe bone pains and GERD.  Will start on Prolia.  Counseling on management of simple avascular left ovarian cyst and management of osteoporosis, review of documentation, for 25 minutes.  Princess Bruins MD, 11:08 AM 01/08/2022

## 2022-01-20 NOTE — Telephone Encounter (Signed)
Prior auth pending

## 2022-01-21 NOTE — Telephone Encounter (Addendum)
Annual exam 08/27/2021  Calcium    9.4         Date 01/23/2022  Upcoming dental procedures   Prior Authorization needed Yes approved auth [id number] VALID 01/20/2022-01/21/2023 Prior auth scanned in system  Pt estimated Cost $301   Appt 02/04/2022    Coverage Details:20% one dose, 20% admin fee

## 2022-01-23 ENCOUNTER — Other Ambulatory Visit: Payer: Medicare HMO

## 2022-01-23 DIAGNOSIS — Z780 Asymptomatic menopausal state: Secondary | ICD-10-CM

## 2022-01-23 DIAGNOSIS — M816 Localized osteoporosis [Lequesne]: Secondary | ICD-10-CM

## 2022-01-23 LAB — CALCIUM: Calcium: 9.4 mg/dL (ref 8.6–10.4)

## 2022-02-04 ENCOUNTER — Ambulatory Visit (INDEPENDENT_AMBULATORY_CARE_PROVIDER_SITE_OTHER): Payer: Medicare HMO

## 2022-02-04 DIAGNOSIS — M810 Age-related osteoporosis without current pathological fracture: Secondary | ICD-10-CM

## 2022-02-04 MED ORDER — DENOSUMAB 60 MG/ML ~~LOC~~ SOSY
60.0000 mg | PREFILLED_SYRINGE | Freq: Once | SUBCUTANEOUS | Status: AC
  Administered 2022-02-04: 60 mg via SUBCUTANEOUS

## 2022-06-24 ENCOUNTER — Other Ambulatory Visit: Payer: Self-pay | Admitting: Obstetrics & Gynecology

## 2022-06-24 DIAGNOSIS — Z1231 Encounter for screening mammogram for malignant neoplasm of breast: Secondary | ICD-10-CM

## 2022-07-24 ENCOUNTER — Telehealth: Payer: Self-pay | Admitting: *Deleted

## 2022-07-24 DIAGNOSIS — M810 Age-related osteoporosis without current pathological fracture: Secondary | ICD-10-CM

## 2022-07-24 NOTE — Telephone Encounter (Signed)
Deductible n/a  OOP MAX $4500 ($0 met)  Annual exam 08/27/2021  Calcium   9.4          Date 01/23/2022  Upcoming dental procedures   Is Prior Authorization needed approved auth [id number] VALID 01/20/2022-01/21/2023 Prior auth scanned in system  Pt estimated Cost $301  Pt states she will call us back when she wants to take it due to cost     Coverage Details:  :20% one dose, 20% admin fee

## 2022-08-13 ENCOUNTER — Ambulatory Visit
Admission: RE | Admit: 2022-08-13 | Discharge: 2022-08-13 | Disposition: A | Discharge status: Home / Self Care | Payer: Medicare HMO | Source: Ambulatory Visit | Attending: Obstetrics & Gynecology | Admitting: Obstetrics & Gynecology

## 2022-08-13 DIAGNOSIS — Z1231 Encounter for screening mammogram for malignant neoplasm of breast: Secondary | ICD-10-CM

## 2022-11-10 MED ORDER — DENOSUMAB 60 MG/ML ~~LOC~~ SOSY
60.0000 mg | PREFILLED_SYRINGE | Freq: Once | SUBCUTANEOUS | Status: AC
  Administered 2022-11-11: 60 mg via SUBCUTANEOUS

## 2022-11-10 NOTE — Addendum Note (Signed)
Addended by: Arnold Long T on: 11/10/2022 04:23 PM   Modules accepted: Orders

## 2022-11-10 NOTE — Telephone Encounter (Signed)
Patient called and left voicemail stating she has decided she would like to get her Prolia injection at her annual exam tomorrow. Benefits were reviewed with patient and February. No insurance change. Patient agreeable to pay estimated $301. No upcoming dental procedures. PA on file and good through 01-21-23. Auth #: [id number].

## 2022-11-11 ENCOUNTER — Ambulatory Visit (INDEPENDENT_AMBULATORY_CARE_PROVIDER_SITE_OTHER): Payer: Medicare HMO | Admitting: Obstetrics & Gynecology

## 2022-11-11 ENCOUNTER — Encounter: Payer: Self-pay | Admitting: Obstetrics & Gynecology

## 2022-11-11 VITALS — BP 108/70 | HR 74 | Ht <= 58 in | Wt 106.0 lb

## 2022-11-11 DIAGNOSIS — N904 Leukoplakia of vulva: Secondary | ICD-10-CM

## 2022-11-11 DIAGNOSIS — Z9289 Personal history of other medical treatment: Secondary | ICD-10-CM

## 2022-11-11 DIAGNOSIS — M810 Age-related osteoporosis without current pathological fracture: Secondary | ICD-10-CM | POA: Diagnosis not present

## 2022-11-11 DIAGNOSIS — M816 Localized osteoporosis [Lequesne]: Secondary | ICD-10-CM

## 2022-11-11 DIAGNOSIS — B009 Herpesviral infection, unspecified: Secondary | ICD-10-CM | POA: Diagnosis not present

## 2022-11-11 DIAGNOSIS — Z780 Asymptomatic menopausal state: Secondary | ICD-10-CM | POA: Diagnosis not present

## 2022-11-11 DIAGNOSIS — Z9189 Other specified personal risk factors, not elsewhere classified: Secondary | ICD-10-CM

## 2022-11-11 DIAGNOSIS — Z01419 Encounter for gynecological examination (general) (routine) without abnormal findings: Secondary | ICD-10-CM

## 2022-11-11 MED ORDER — CLOBETASOL PROPIONATE 0.05 % EX OINT: 1.0000 | TOPICAL_OINTMENT | Freq: Two times a day (BID) | CUTANEOUS | 4 refills | Status: AC

## 2022-11-11 NOTE — Progress Notes (Signed)
Carla Alvarado 1946/05/22 [id number]   History:    77 y.o.  [id number]   RP:  Established patient presenting for annual gyn exam    HPI:  Postmenopause, well on no HRT.  S/P Total Hysterectomy.  Pap Neg 07/2020. Vulvar itching/burning associated with Lichen Sclerosus.  Not using Clobetasol regularly.  Breasts normal.  Mammo Neg 07/2022.  Last BD 09/04/2020 with Osteoporosis T-Score -2.5 at the Left Femoral Neck.  Started on Fosamax, stopped d/t joint pains. Prolia injection today.  Ca++ normal 9.4.  Vit D supplement 2000 IU daily, Ca++.  Walking regularly. BMI 22.35.  Health labs with Fam MD.  Alen Bleacher 05-28-2018, every 5 yrs.  Mother died of Colon Ca.  Pelvic US 01-19-2022: Right ovary is atrophic in appearance with a small avascular residual follicle again noted measured at 1.4 x 1.2 cm. Previously measured at 1.2 x 1.2 cm, therefore no significant change. Left ovary is multicystic in appearance with a 2.0 x 1.5 cm simple avascular cyst which was previously measured at 2.1 x 1.3 cm, therefore no significant change. Positive perfusion to both ovaries. No free fluid in the pelvis.      Past medical history,surgical history, family history and social history were all reviewed and documented in the EPIC chart.  Gynecologic History No LMP recorded. Patient has had a hysterectomy.  Obstetric History OB History  Gravida Para Term Preterm AB Living  4 2 2   2 2   SAB IAB Ectopic Multiple Live Births  2            # Outcome Date GA Lbr Len/2nd Weight Sex Delivery Anes PTL Lv  4 Term           3 Term           2 SAB           1 SAB              ROS: A ROS was performed and pertinent positives and negatives are included in the history. GENERAL: No fevers or chills. HEENT: No change in vision, no earache, sore throat or sinus congestion. NECK: No pain or stiffness. CARDIOVASCULAR: No chest pain or pressure. No palpitations. PULMONARY: No shortness of breath, cough or wheeze. GASTROINTESTINAL: No  abdominal pain, nausea, vomiting or diarrhea, melena or bright red blood per rectum. GENITOURINARY: No urinary frequency, urgency, hesitancy or dysuria. MUSCULOSKELETAL: No joint or muscle pain, no back pain, no recent trauma. DERMATOLOGIC: No rash, no itching, no lesions. ENDOCRINE: No polyuria, polydipsia, no heat or cold intolerance. No recent change in weight. HEMATOLOGICAL: No anemia or easy bruising or bleeding. NEUROLOGIC: No headache, seizures, numbness, tingling or weakness. PSYCHIATRIC: No depression, no loss of interest in normal activity or change in sleep pattern.     Exam:   BP 108/70   Pulse 74   Ht 4' 9.75" (1.467 m)   Wt 106 lb (48.1 kg)   SpO2 97%   BMI 22.35 kg/m   Body mass index is 22.35 kg/m.  General appearance : Well developed well nourished female. No acute distress HEENT: Eyes: no retinal hemorrhage or exudates,  Neck supple, trachea midline, no carotid bruits, no thyroidmegaly Lungs: Clear to auscultation, no rhonchi or wheezes, or rib retractions  Heart: Regular rate and rhythm, no murmurs or gallops Breast:Examined in sitting and supine position were symmetrical in appearance, no palpable masses or tenderness,  no skin retraction, no nipple inversion, no nipple discharge, no skin discoloration, no axillary or  supraclavicular lymphadenopathy Abdomen: no palpable masses or tenderness, no rebound or guarding Extremities: no edema or skin discoloration or tenderness  Pelvic: Vulva: Lichen sclerosus, worst area anteriorly             Vagina: No gross lesions or discharge  Cervix: No gross lesions or discharge  Uterus  AV, normal size, shape and consistency, non-tender and mobile  Adnexa  Without masses or tenderness  Anus: Normal   Assessment/Plan:  77 y.o. female for annual exam   1. Well female exam with routine gynecological exam Postmenopause, well on no HRT.  S/P Total Hysterectomy.  Pap Neg 07/2020. Vulvar itching/burning associated with Lichen  Sclerosus.  Not using Clobetasol regularly.  Breasts normal.  Mammo Neg 07/2022.  Last BD 09/04/2020 with Osteoporosis T-Score -2.5 at the Left Femoral Neck.  Started on Fosamax, stopped d/t joint pains. Prolia injection today.  Ca++ normal 9.4.  Vit D supplement 2000 IU daily, Ca++.  Walking regularly. BMI 22.35.  Health labs with Fam MD.  Alen Bleacher 2018/05/29, every 5 yrs.  Mother died of Colon Ca.  2. Postmenopause Postmenopause, well on no HRT.  S/P Total Hysterectomy.   3. Localized osteoporosis without current pathological fracture  Last BD 09/04/2020 with Osteoporosis T-Score -2.5 at the Left Femoral Neck.  Started on Fosamax, stopped d/t joint pains. Prolia injection today.  Ca++ normal 9.4.  Vit D supplement 2000 IU daily, Ca++.  Walking regularly.  [facility] - [facility] Bone Density; Future  4. Lichen sclerosus et atrophicus of the vulva Restart on Clobetasol.  Changing to the ointment.  Usage reviewed, prescription sent to pharmacy.  5. Personal history of other medical treatment  Other orders - clobetasol ointment (TEMOVATE) 0.05 %; Apply 1 Application topically 2 (two) times daily for 14 days. Then slowly wean to twice a week long term.   Genia Del MD, 4:09 PM

## 2022-12-02 ENCOUNTER — Telehealth: Payer: Self-pay

## 2022-12-02 NOTE — Telephone Encounter (Signed)
Pt LVM in triage line stating that she wanted to make you aware that she had scheduled her DEXA scan but next available @ [facility] wasn't until 06/08/2023. She just wanted to make you aware.   Please advise if there is anything you would like for me to do or close if nothing. Thanks.

## 2022-12-02 NOTE — Telephone Encounter (Signed)
Per ML: "You can close. Dr. Elbert Ewings"  Encounter closed.

## 2023-03-16 ENCOUNTER — Encounter: Payer: Self-pay | Admitting: Gastroenterology

## 2023-04-15 ENCOUNTER — Encounter: Payer: Self-pay | Admitting: Gastroenterology

## 2023-04-15 ENCOUNTER — Ambulatory Visit: Payer: Medicare HMO

## 2023-04-15 VITALS — Ht <= 58 in | Wt 106.2 lb

## 2023-04-15 DIAGNOSIS — Z860100 Personal history of colon polyps, unspecified: Secondary | ICD-10-CM

## 2023-04-15 MED ORDER — NA SULFATE-K SULFATE-MG SULF 17.5-3.13-1.6 GM/177ML PO SOLN: 1.0000 | Freq: Once | ORAL | 0 refills | Status: AC

## 2023-04-15 NOTE — Progress Notes (Signed)

## 2023-04-16 ENCOUNTER — Other Ambulatory Visit: Payer: Self-pay | Admitting: Family Medicine

## 2023-04-16 DIAGNOSIS — Z1231 Encounter for screening mammogram for malignant neoplasm of breast: Secondary | ICD-10-CM

## 2023-05-06 ENCOUNTER — Ambulatory Visit: Payer: Medicare HMO | Admitting: Gastroenterology

## 2023-05-06 ENCOUNTER — Encounter: Payer: Self-pay | Admitting: Gastroenterology

## 2023-05-06 VITALS — BP 122/65 | HR 61 | Temp 98.2°F | Resp 13 | Ht <= 58 in | Wt 106.0 lb

## 2023-05-06 DIAGNOSIS — Z09 Encounter for follow-up examination after completed treatment for conditions other than malignant neoplasm: Secondary | ICD-10-CM

## 2023-05-06 DIAGNOSIS — D123 Benign neoplasm of transverse colon: Secondary | ICD-10-CM

## 2023-05-06 DIAGNOSIS — Z860101 Personal history of adenomatous and serrated colon polyps: Secondary | ICD-10-CM | POA: Diagnosis not present

## 2023-05-06 DIAGNOSIS — D128 Benign neoplasm of rectum: Secondary | ICD-10-CM

## 2023-05-06 DIAGNOSIS — D124 Benign neoplasm of descending colon: Secondary | ICD-10-CM

## 2023-05-06 DIAGNOSIS — K635 Polyp of colon: Secondary | ICD-10-CM | POA: Diagnosis not present

## 2023-05-06 DIAGNOSIS — Z800 Family history of malignant neoplasm of digestive organs: Secondary | ICD-10-CM

## 2023-05-06 MED ORDER — SODIUM CHLORIDE 0.9 % IV SOLN: 500.0000 mL | Freq: Once | INTRAVENOUS | Status: DC

## 2023-05-06 NOTE — Progress Notes (Signed)
History & Physical  Primary Care Physician:  Joycelyn Rua, MD Primary Gastroenterologist: Claudette Head, MD  Impression / Plan:  Personal history of adenomatous colon polyps, family history of colon cancer, multiple second-degree relatives, for colonoscopy.  CHIEF COMPLAINT: Family history of colon cancer, Personal history of colon polyps   HPI: Carla Alvarado is a 77 y.o. female with a personal history of adenomatous colon polyps and a family history of colon cancer in multiple second-degree relatives for colonoscopy.    Past Medical History:  Diagnosis Date   Allergy    Anemia    Anxiety    Arthritis    Cancer (HCC)    Melanoma   Depression    Family history of breast cancer    Family history of colon cancer    Family history of Lynch syndrome    Family history of prostate cancer    GERD (gastroesophageal reflux disease)    History of renal stone    HSV infection    oral   Hypercholesteremia    Hypothyroid    Lichen sclerosus    Migraine    Osteopenia 07/2018   T score -2.2 with slight loss at spine stable at the hips   Osteoporosis    Personal history of malignant melanoma    Pneumonia    Skin cancer 07/2015   Tubular adenoma of colon 01/2013    Past Surgical History:  Procedure Laterality Date   BREAST EXCISIONAL BIOPSY Right    breast papilloma     CESAREAN SECTION     MELANOMA EXCISION WITH SENTINEL LYMPH NODE BIOPSY     VAGINAL HYSTERECTOMY  1990    Prior to Admission medications   Medication Sig Start Date End Date Taking? Authorizing Provider  levothyroxine (SYNTHROID, LEVOTHROID) 88 MCG tablet Take 88 mcg by mouth daily before breakfast.   Yes [provider]  lovastatin (MEVACOR) 40 MG tablet Take 40 mg by mouth daily. 07/08/21  Yes [provider]  MAGNESIUM GLYCINATE PO Take 200 mg by mouth daily.   Yes [provider]  Multiple Vitamin (MULTIVITAMIN PO) Take by mouth.   Yes [provider]  Multiple  Vitamins-Minerals (HAIR SKIN AND NAILS FORMULA PO) Take by mouth.   Yes [provider]  NONFORMULARY OR COMPOUNDED ITEM Estradiol vaginal cream 0.02% insert twice weekly 07/17/21  Yes Genia Del, MD  OVER THE COUNTER MEDICATION Calcium 600 mg, one capsule daily.   Yes [provider]  OVER THE COUNTER MEDICATION Vitamin D 3, one capsule daily.   Yes [provider]  OVER THE COUNTER MEDICATION Vitamin B 12, 2000 mcg. One tablet daily.   Yes [provider]  SUMAtriptan (IMITREX) 100 MG tablet Take 100 mg by mouth every 2 (two) hours as needed for migraine. May repeat in 2 hours if headache persists or recurs.   Yes [provider]  Zinc 100 MG TABS 1 tablet Orally Once a day for 30 day(s)   Yes [provider]  zolpidem (AMBIEN) 5 MG tablet Take 5 mg by mouth at bedtime as needed for sleep.   Yes [provider]  Apoaequorin (PREVAGEN PO) Take by mouth.    [provider]  diazepam (VALIUM) 2 MG tablet Take 2 mg by mouth at bedtime as needed for anxiety.    [provider]  Ferrous Fumarate (IRON) 18 MG TBCR Take by mouth.    [provider]  Ibuprofen (ADVIL) 200 MG CAPS  [provider]    Current Outpatient Medications  Medication Sig Dispense Refill   levothyroxine (SYNTHROID, LEVOTHROID) 88 MCG tablet Take 88 mcg by mouth daily before breakfast.     lovastatin (MEVACOR) 40 MG tablet Take 40 mg by mouth daily.     MAGNESIUM GLYCINATE PO Take 200 mg by mouth daily.     Multiple Vitamin (MULTIVITAMIN PO) Take by mouth.     Multiple Vitamins-Minerals (HAIR SKIN AND NAILS FORMULA PO) Take by mouth.     NONFORMULARY OR COMPOUNDED ITEM Estradiol vaginal cream 0.02% insert twice weekly 24 each 4   OVER THE COUNTER MEDICATION Calcium 600 mg, one capsule daily.     OVER THE COUNTER MEDICATION Vitamin D 3, one capsule daily.     OVER THE COUNTER MEDICATION Vitamin B 12, 2000 mcg. One tablet  daily.     SUMAtriptan (IMITREX) 100 MG tablet Take 100 mg by mouth every 2 (two) hours as needed for migraine. May repeat in 2 hours if headache persists or recurs.     Zinc 100 MG TABS 1 tablet Orally Once a day for 30 day(s)     zolpidem (AMBIEN) 5 MG tablet Take 5 mg by mouth at bedtime as needed for sleep.     Apoaequorin (PREVAGEN PO) Take by mouth.     diazepam (VALIUM) 2 MG tablet Take 2 mg by mouth at bedtime as needed for anxiety.     Ferrous Fumarate (IRON) 18 MG TBCR Take by mouth.     Ibuprofen (ADVIL) 200 MG CAPS      Current Facility-Administered Medications  Medication Dose Route Frequency Provider Last Rate Last Admin   0.9 %  sodium chloride infusion  500 mL Intravenous Once Meryl Dare, MD        Allergies as of 05/06/2023 - Review Complete 05/06/2023  Allergen Reaction Noted   Morphine and codeine Nausea And Vomiting 06/09/2015   Atorvastatin  12/17/2021   Levothyroxine sodium Hives 05/20/2011   Sertraline hcl  12/17/2021    Family History  Problem Relation Age of Onset   Hypertension Mother    Colon cancer Mother        mets from breast cancer   Heart disease Mother    Breast cancer Mother        Age 3, mets all over    Colon polyps Sister    Breast cancer Sister 52       maternal half sister   Heart disease Brother    Breast cancer Maternal Aunt 63   Breast cancer Maternal Aunt        Age 62   Breast cancer Paternal Aunt        Age 40   Diabetes Maternal Grandmother    Heart disease Maternal Grandfather    Esophageal cancer Son    Throat cancer Son    Prostate cancer Cousin        maternal first cousin   Colon cancer Cousin        maternal first cousin   Cancer Cousin        NOS - maternal first cousin   Breast cancer Cousin 93       TN breast cancer, maternal first cousin   Breast cancer Other        Lynch syndrome   Rectal cancer Neg Hx    Stomach cancer Neg Hx     Social History   Socioeconomic History   Marital status:  Divorced  Spouse name: [name]   Number of children: 2   Years of education: 12+   Highest education level: Not on file  Occupational History    Employer: [employer]  Tobacco Use   Smoking status: Never   Smokeless tobacco: Never  Vaping Use   Vaping status: Never Used  Substance and Sexual Activity   Alcohol use: Yes    Comment: occassionally   Drug use: No   Sexual activity: Not Currently    Birth control/protection: Surgical    Comment: 1st intercourse 77 yo-Fewer than 5 partners, hysterectomy  Other Topics Concern   Not on file  Social History Narrative   Patient lives at home with Husband [name]    Patient has 2 children.    Patient works at [employer].    Patient has 2 years of college.    Patient is right handed         Social Determinants of Health   Financial Resource Strain: Not on file  Food Insecurity: Not on file  Transportation Needs: Not on file  Physical Activity: Not on file  Stress: Not on file  Social Connections: Not on file  Intimate Partner Violence: Not on file    Review of Systems:  All systems reviewed were negative except where noted in HPI.   Physical Exam:  General:  Alert, well-developed, in NAD Head:  Normocephalic and atraumatic. Eyes:  Sclera clear, no icterus.   Conjunctiva pink. Ears:  Normal auditory acuity. Mouth:  No deformity or lesions.  Neck:  Supple; no masses. Lungs:  Clear throughout to auscultation.   No wheezes, crackles, or rhonchi.  Heart:  Regular rate and rhythm; no murmurs. Abdomen:  Soft, nondistended, nontender. No masses, hepatomegaly. No palpable masses.  Normal bowel sounds.    Rectal:  Deferred   Msk:  Symmetrical without gross deformities. Extremities:  Without edema. Neurologic:  Alert and  oriented x 4; grossly normal neurologically. Skin:  Intact without significant lesions or rashes. Psych:  Alert and cooperative. Normal mood and affect.   Venita Lick. Russella Dar  05/06/2023, 10:58 AM See Loretha Stapler,   GI, to contact our on call provider

## 2023-05-06 NOTE — Progress Notes (Signed)
Called to room to assist during endoscopic procedure.  Patient ID and intended procedure confirmed with present staff. Received instructions for my participation in the procedure from the performing physician.  

## 2023-05-06 NOTE — Op Note (Addendum)
[facility] Patient Name: Carla Alvarado Procedure Date: 05/06/2023 11:04 AM MRN: [id number] Endoscopist: Meryl Dare , MD, [id number] Age: 77 Referring MD:  Date of Birth: 14-Jul-1945 Gender: Female Account #: [id number] Procedure:                Colonoscopy Indications:              Surveillance: Personal history of adenomatous                            polyps on colonoscopy > 5 years ago, Family history                            of colon cancer, multiple 2nd-degree relatives Medicines:                Monitored Anesthesia Care Procedure:                Pre-Anesthesia Assessment:                           - Prior to the procedure, a History and Physical                            was performed, and patient medications and                            allergies were reviewed. The patient's tolerance of                            previous anesthesia was also reviewed. The risks                            and benefits of the procedure and the sedation                            options and risks were discussed with the patient.                            All questions were answered, and informed consent                            was obtained. Prior Anticoagulants: The patient has                            taken no anticoagulant or antiplatelet agents. ASA                            Grade Assessment: II - A patient with mild systemic                            disease. After reviewing the risks and benefits,                            the patient was deemed in satisfactory condition to  undergo the procedure.                           After obtaining informed consent, the colonoscope                            was passed under direct vision. Throughout the                            procedure, the patient's blood pressure, pulse, and                            oxygen saturations were monitored continuously. The                            Olympus  Scope SN: [id number] was introduced through                            the anus and advanced to the the cecum, identified                            by appendiceal orifice and ileocecal valve. The                            ileocecal valve, appendiceal orifice, and rectum                            were photographed. The quality of the bowel                            preparation was excellent. The colonoscopy was                            performed without difficulty. The patient tolerated                            the procedure well. Unable to safely retroflex. Scope In: 11:11:04 AM Scope Out: 11:24:44 AM Scope Withdrawal Time: 0 hours 12 minutes 4 seconds  Total Procedure Duration: 0 hours 13 minutes 40 seconds  Findings:                 The perianal and digital rectal examinations were                            normal.                           Two sessile polyps were found in the descending                            colon and transverse colon. The polyps were 7 to 8                            mm in size. These polyps were removed with a cold  snare. Resection and retrieval were complete.                           A 3 mm polyp was found in the distal rectum. The                            polyp was sessile. The polyp was removed with a                            cold biopsy forceps. Resection and retrieval were                            complete.                           Multiple medium-mouthed and small-mouthed                            diverticula were found in the sigmoid colon,                            descending colon and transverse colon. There was                            evidence of an impacted diverticulum. There was no                            evidence of diverticular bleeding.                           Internal hemorrhoids were found. The hemorrhoids                            were small and Grade I (internal hemorrhoids that                             do not prolapse).                           The exam was otherwise without abnormality on                            direct views. Complications:            No immediate complications. Estimated blood loss:                            None. Estimated Blood Loss:     Estimated blood loss: none. Impression:               - Two 7 to 8 mm polyps in the descending colon and                            in the transverse colon, removed with a cold snare.  Resected and retrieved.                           - One 3 mm polyp in the distal rectum, removed with                            a cold biopsy forceps. Resected and retrieved.                           - Mild diverticulosis in the sigmoid colon, in the                            descending colon and in the transverse colon.                           - Internal hemorrhoids.                           - The examination was otherwise normal on direct                            views. Recommendation:           - Repeat colonoscopy vs no repeat due to age after                            studies are complete for surveillance based on                            pathology results.                           - Patient has a contact number available for                            emergencies. The signs and symptoms of potential                            delayed complications were discussed with the                            patient. Return to normal activities tomorrow.                            Written discharge instructions were provided to the                            patient.                           - Resume previous diet adding high fiber.                           - Continue present medications.                           -  Await pathology results. Meryl Dare, MD 05/06/2023 11:30:11 AM This report has been signed electronically.

## 2023-05-06 NOTE — Progress Notes (Signed)
A/O x 3, gd SR's, VSS, report to RN

## 2023-05-06 NOTE — Patient Instructions (Addendum)
Educational handout provided to patient related to Hemorrhoids, Polyps, and Diverticulosis  Resume previous diet ADDING HIGH FIBER  Continue present medications  Awaiting pathology results   YOU HAD AN ENDOSCOPIC PROCEDURE TODAY AT THE [facility]:   Refer to the procedure report that was given to you for any specific questions about what was found during the examination.  If the procedure report does not answer your questions, please call your gastroenterologist to clarify.  If you requested that your care partner not be given the details of your procedure findings, then the procedure report has been included in a sealed envelope for you to review at your convenience later.  YOU SHOULD EXPECT: Some feelings of bloating in the abdomen. Passage of more gas than usual.  Walking can help get rid of the air that was put into your GI tract during the procedure and reduce the bloating. If you had a lower endoscopy (such as a colonoscopy or flexible sigmoidoscopy) you may notice spotting of blood in your stool or on the toilet paper. If you underwent a bowel prep for your procedure, you may not have a normal bowel movement for a few days.  Please Note:  You might notice some irritation and congestion in your nose or some drainage.  This is from the oxygen used during your procedure.  There is no need for concern and it should clear up in a day or so.  SYMPTOMS TO REPORT IMMEDIATELY:  Following lower endoscopy (colonoscopy or flexible sigmoidoscopy):  Excessive amounts of blood in the stool  Significant tenderness or worsening of abdominal pains  Swelling of the abdomen that is new, acute  Fever of 100F or higher  For urgent or emergent issues, a gastroenterologist can be reached at any hour by calling [phone]. Do not use MyChart messaging for urgent concerns.    DIET:  We do recommend a small meal at first, but then you may proceed to your regular diet.  Drink plenty of  fluids but you should avoid alcoholic beverages for 24 hours.  ACTIVITY:  You should plan to take it easy for the rest of today and you should NOT DRIVE or use heavy machinery until tomorrow (because of the sedation medicines used during the test).    FOLLOW UP: Our staff will call the number listed on your records the next business day following your procedure.  We will call around 7:15- 8:00 am to check on you and address any questions or concerns that you may have regarding the information given to you following your procedure. If we do not reach you, we will leave a message.     If any biopsies were taken you will be contacted by phone or by letter within the next 1-3 weeks.  Please call us at [phone] if you have not heard about the biopsies in 3 weeks.    SIGNATURES/CONFIDENTIALITY: You and/or your care partner have signed paperwork which will be entered into your electronic medical record.  These signatures attest to the fact that that the information above on your After Visit Summary has been reviewed and is understood.  Full responsibility of the confidentiality of this discharge information lies with you and/or your care-partner.

## 2023-05-07 ENCOUNTER — Telehealth: Payer: Self-pay | Admitting: *Deleted

## 2023-05-07 NOTE — Telephone Encounter (Signed)
  Follow up Call-     05/06/2023   10:11 AM  Call back number  Post procedure Call Back phone  # [phone]  Permission to leave phone message Yes     Patient questions:  Do you have a fever, pain , or abdominal swelling? No. Pain Score  0 *  Have you tolerated food without any problems? Yes.    Have you been able to return to your normal activities? Yes.    Do you have any questions about your discharge instructions: Diet   No. Medications  No. Follow up visit  No.  Do you have questions or concerns about your Care? No.  Actions: * If pain score is 4 or above: No action needed, pain <4.

## 2023-05-10 LAB — SURGICAL PATHOLOGY

## 2023-05-18 ENCOUNTER — Encounter: Payer: Self-pay | Admitting: Gastroenterology

## 2023-05-27 ENCOUNTER — Telehealth: Payer: Self-pay

## 2023-05-27 NOTE — Telephone Encounter (Signed)
Pt LVM in triage line x2 inquiring what the name/ingredient was in the injection that she receives for her DEXA was so that she can acquire the correct type of insurance.   Requesting cb.

## 2023-05-27 NOTE — Telephone Encounter (Signed)
Pt advised that she gets the "prolia" injection for her bone density.   Pt voiced understanding and appreciation for the call.   Routing to provider for final review and closing encounter.

## 2023-06-07 ENCOUNTER — Other Ambulatory Visit: Payer: Medicare HMO

## 2023-06-08 ENCOUNTER — Inpatient Hospital Stay
Admission: RE | Admit: 2023-06-08 | Discharge: 2023-06-08 | Disposition: A | Discharge status: Home / Self Care | Payer: Medicare HMO | Source: Ambulatory Visit | Attending: Obstetrics & Gynecology | Admitting: Obstetrics & Gynecology

## 2023-06-08 DIAGNOSIS — M816 Localized osteoporosis [Lequesne]: Secondary | ICD-10-CM

## 2023-07-29 ENCOUNTER — Other Ambulatory Visit: Payer: Self-pay

## 2023-07-29 DIAGNOSIS — Z780 Asymptomatic menopausal state: Secondary | ICD-10-CM

## 2023-07-29 MED ORDER — NONFORMULARY OR COMPOUNDED ITEM: 0 refills | Status: AC

## 2023-07-29 NOTE — Telephone Encounter (Signed)
 Med refill request: Estradiol  vaginal cream 0.02% Last AEX: 11/11/22 Next AEX: not scheduled  Last MMG (if hormonal med) last 08/13/22 birads cat 1 neg.  Refill authorized: Last rx 07/17/21 24 with 4 refills. Please approve or deny as appropriate.

## 2023-08-09 LAB — LAB REPORT - SCANNED: TSH: 1.04

## 2023-08-17 ENCOUNTER — Ambulatory Visit: Payer: Medicare HMO

## 2023-09-09 ENCOUNTER — Ambulatory Visit
Admission: RE | Admit: 2023-09-09 | Discharge: 2023-09-09 | Disposition: A | Discharge status: Home / Self Care | Payer: Medicare HMO | Source: Ambulatory Visit | Attending: Family Medicine | Admitting: Family Medicine

## 2023-09-09 DIAGNOSIS — Z1231 Encounter for screening mammogram for malignant neoplasm of breast: Secondary | ICD-10-CM

## 2023-09-14 ENCOUNTER — Other Ambulatory Visit: Payer: Self-pay | Admitting: Family Medicine

## 2023-09-14 DIAGNOSIS — R928 Other abnormal and inconclusive findings on diagnostic imaging of breast: Secondary | ICD-10-CM

## 2023-09-23 ENCOUNTER — Encounter

## 2023-09-23 ENCOUNTER — Other Ambulatory Visit

## 2023-10-01 ENCOUNTER — Ambulatory Visit
Admission: RE | Admit: 2023-10-01 | Discharge: 2023-10-01 | Disposition: A | Discharge status: Home / Self Care | Source: Ambulatory Visit | Attending: Family Medicine | Admitting: Family Medicine

## 2023-10-01 ENCOUNTER — Ambulatory Visit

## 2023-10-01 DIAGNOSIS — R928 Other abnormal and inconclusive findings on diagnostic imaging of breast: Secondary | ICD-10-CM

## 2023-11-11 ENCOUNTER — Encounter: Payer: Self-pay | Admitting: Obstetrics and Gynecology

## 2023-11-11 ENCOUNTER — Ambulatory Visit (INDEPENDENT_AMBULATORY_CARE_PROVIDER_SITE_OTHER): Payer: Self-pay | Admitting: Obstetrics and Gynecology

## 2023-11-11 VITALS — BP 114/70 | HR 76 | Ht <= 58 in | Wt 105.0 lb

## 2023-11-11 DIAGNOSIS — Z9289 Personal history of other medical treatment: Secondary | ICD-10-CM | POA: Diagnosis not present

## 2023-11-11 DIAGNOSIS — M818 Other osteoporosis without current pathological fracture: Secondary | ICD-10-CM | POA: Diagnosis not present

## 2023-11-11 DIAGNOSIS — B009 Herpesviral infection, unspecified: Secondary | ICD-10-CM

## 2023-11-11 DIAGNOSIS — Z9189 Other specified personal risk factors, not elsewhere classified: Secondary | ICD-10-CM

## 2023-11-11 DIAGNOSIS — N952 Postmenopausal atrophic vaginitis: Secondary | ICD-10-CM

## 2023-11-11 MED ORDER — DENOSUMAB 60 MG/ML ~~LOC~~ SOSY: 60.0000 mg | PREFILLED_SYRINGE | SUBCUTANEOUS | Status: AC

## 2023-11-11 NOTE — Progress Notes (Signed)
 78 y.o. y.o. female here for annual exam. No LMP recorded. Patient has had a hysterectomy.    [id number]   RP:  Established patient presenting for annual medicare gyn exam    HPI:  Postmenopause, well on no HRT.  S/P Total Hysterectomy.  Pap Neg 07/2020. Vulvar itching/burning associated with Lichen Sclerosus.  Not using Clobetasol  regularly.  Breasts normal.  Mammo Neg 10/01/23.  BD 09/04/2020 with Osteoporosis T-Score -2.5 at the Left Femoral Neck. 06/08/23 dxa  Left hip at -2.3 Started on Fosamax , stopped d/t severe diffuse joint pains. Prolia  had only 2 shots and then lost to follow-up.  To try to get a one year bone scan without treatment, if possible. Has new insurance and would like evenity then return to prolia .  Discussed risk of rebound fracture if she stops and does no further bone treatment.  Ca++ normal 9.4.  Vit D supplement 2000 IU daily, Ca++.  Walking regularly. BMI 22.35.  Health labs with Fam MD.  Gracy Law May 19, 2018, every 5 yrs.  Mother died of Colon Ca.  Pelvic US  01/08/22: Right ovary is atrophic in appearance with a small avascular residual follicle again noted measured at 1.4 x 1.2 cm. Previously measured at 1.2 x 1.2 cm, therefore no significant change. Left ovary is multicystic in appearance with a 2.0 x 1.5 cm simple avascular cyst which was previously measured at 2.1 x 1.3 cm, therefore no significant change. Positive perfusion to both ovaries. No free fluid in the pelvis.  Body mass index is 22.14 kg/m.      No data to display          Pulse 76, height 4' 9.75" (1.467 m), weight 105 lb (47.6 kg), SpO2 99%.  No results found for: "DIAGPAP", "HPVHIGH", "ADEQPAP"  GYN HISTORY: No results found for: "DIAGPAP", "HPVHIGH", "ADEQPAP"  OB History  Gravida Para Term Preterm AB Living  4 2 2  2 2   SAB IAB Ectopic Multiple Live Births  2        # Outcome Date GA Lbr Len/2nd Weight Sex Type Anes PTL Lv  4 Term           3 Term           2 SAB           1 SAB              Past Medical History:  Diagnosis Date   Allergy    Anemia    Anxiety    Arthritis    Cancer (HCC)    Melanoma   Depression    Family history of breast cancer    Family history of colon cancer    Family history of Lynch syndrome    Family history of prostate cancer    GERD (gastroesophageal reflux disease)    History of renal stone    HSV infection    oral   Hypercholesteremia    Hypothyroid    Lichen sclerosus    Migraine    Osteopenia 07/2018   T score -2.2 with slight loss at spine stable at the hips   Osteoporosis    Personal history of malignant melanoma    Pneumonia    Skin cancer 07/2015   Tubular adenoma of colon 01/2013    Past Surgical History:  Procedure Laterality Date   BREAST EXCISIONAL BIOPSY Right    breast papilloma     CESAREAN SECTION     MELANOMA EXCISION WITH SENTINEL LYMPH NODE BIOPSY  VAGINAL HYSTERECTOMY  1990    Current Outpatient Medications on File Prior to Visit  Medication Sig Dispense Refill   Apoaequorin (PREVAGEN PO) Take by mouth.     clobetasol  ointment (TEMOVATE ) 0.05 % PLEASE SEE ATTACHED FOR DETAILED DIRECTIONS     diazepam (VALIUM) 2 MG tablet Take 2 mg by mouth at bedtime as needed for anxiety.     Ferrous Fumarate (IRON) 18 MG TBCR Take by mouth.     Ibuprofen (ADVIL) 200 MG CAPS      levothyroxine (SYNTHROID, LEVOTHROID) 88 MCG tablet Take 88 mcg by mouth daily before breakfast.     lovastatin (MEVACOR) 40 MG tablet Take 40 mg by mouth daily.     MAGNESIUM GLYCINATE PO Take 200 mg by mouth daily.     meloxicam (MOBIC) 15 MG tablet Take 1 tablet every day by oral route with meals for 30 days.     methocarbamol (ROBAXIN) 500 MG tablet take 1 tablet at bedtime, can take every 8 hours during the day if no significant sedating side effects     Multiple Vitamin (MULTIVITAMIN PO) Take by mouth.     Multiple Vitamins-Minerals (HAIR SKIN AND NAILS FORMULA PO) Take by mouth.     NONFORMULARY OR COMPOUNDED ITEM Estradiol   vaginal cream 0.02% insert twice weekly 24 each 0   OVER THE COUNTER MEDICATION Calcium 600 mg, one capsule daily.     OVER THE COUNTER MEDICATION Vitamin D  3, one capsule daily.     OVER THE COUNTER MEDICATION Vitamin B 12, 2000 mcg. One tablet daily.     SUMAtriptan (IMITREX) 100 MG tablet Take 100 mg by mouth every 2 (two) hours as needed for migraine. May repeat in 2 hours if headache persists or recurs.     Zinc 100 MG TABS 1 tablet Orally Once a day for 30 day(s)     zolpidem (AMBIEN) 5 MG tablet Take 5 mg by mouth at bedtime as needed for sleep.     No current facility-administered medications on file prior to visit.    Social History   Socioeconomic History   Marital status: Divorced    Spouse name: [name]   Number of children: 2   Years of education: 12+   Highest education level: Not on file  Occupational History    Employer: [employer]  Tobacco Use   Smoking status: Never   Smokeless tobacco: Never  Vaping Use   Vaping status: Never Used  Substance and Sexual Activity   Alcohol use: Yes    Comment: occassionally   Drug use: No   Sexual activity: Not Currently    Partners: Male    Birth control/protection: Surgical, Post-menopausal    Comment: 1st intercourse 78 yo-Fewer than 5 partners, hysterectomy  Other Topics Concern   Not on file  Social History Narrative   Patient lives at home with Husband [name]    Patient has 2 children.    Patient works at [employer].    Patient has 2 years of college.    Patient is right handed         Social Drivers of Corporate investment banker Strain: Not on file  Food Insecurity: Not on file  Transportation Needs: Not on file  Physical Activity: Not on file  Stress: Not on file  Social Connections: Not on file  Intimate Partner Violence: Not on file    Family History  Problem Relation Age of Onset   Hypertension Mother    Colon  cancer Mother        mets from breast cancer   Heart disease Mother    Breast cancer  Mother        Age 75, mets all over    Colon polyps Sister    Breast cancer Sister 44       maternal half sister   Heart disease Brother    Breast cancer Maternal Aunt 82   Breast cancer Maternal Aunt        Age 8   Breast cancer Paternal Aunt        Age 52   Diabetes Maternal Grandmother    Heart disease Maternal Grandfather    Esophageal cancer Son    Throat cancer Son    Prostate cancer Cousin        maternal first cousin   Colon cancer Cousin        maternal first cousin   Cancer Cousin        NOS - maternal first cousin   Breast cancer Cousin 44       TN breast cancer, maternal first cousin   Breast cancer Other        Lynch syndrome   Rectal cancer Neg Hx    Stomach cancer Neg Hx      Allergies  Allergen Reactions   Morphine  And Codeine Nausea And Vomiting    Increased heart rate   Atorvastatin     Other reaction(s): myalgias   Levothyroxine Sodium Hives    Generic brand   Sertraline Hcl     Other reaction(s): weight loss; shakiness      Patient's last menstrual period was No LMP recorded. Patient has had a hysterectomy..            Review of Systems Alls systems reviewed and are negative.     Physical Exam Constitutional:      Appearance: Normal appearance.  Genitourinary:     Vulva and urethral meatus normal.     No lesions in the vagina.     Right Labia: No rash, lesions or skin changes.    Left Labia: No lesions, skin changes or rash.    No vaginal discharge or tenderness.     No vaginal prolapse present.    Moderate vaginal atrophy present.     Right Adnexa: not tender, not palpable and no mass present.    Left Adnexa: not tender, not palpable and no mass present.    No cervical motion tenderness or discharge.     Uterus is not enlarged, tender or irregular.  Breasts:    Right: Normal.     Left: Normal.  HENT:     Head: Normocephalic.  Neck:     Thyroid : No thyroid  mass, thyromegaly or thyroid  tenderness.  Cardiovascular:     Rate  and Rhythm: Normal rate and regular rhythm.     Heart sounds: Normal heart sounds, S1 normal and S2 normal.  Pulmonary:     Effort: Pulmonary effort is normal.     Breath sounds: Normal breath sounds and air entry.  Abdominal:     General: There is no distension.     Palpations: Abdomen is soft. There is no mass.     Tenderness: There is no abdominal tenderness. There is no guarding or rebound.  Musculoskeletal:        General: Normal range of motion.     Cervical back: Full passive range of motion without pain, normal range of motion and neck supple.  No tenderness.     Right lower leg: No edema.     Left lower leg: No edema.  Neurological:     Mental Status: She is alert.  Skin:    General: Skin is warm.  Psychiatric:        Mood and Affect: Mood normal.        Behavior: Behavior normal.        Thought Content: Thought content normal.  Vitals and nursing note reviewed. Exam conducted with a chaperone present.    Joy, CMA was present for entire exam   A:         Well Woman GYN exam                             P:        Pap smear not indicated Encouraged annual mammogram screening Colon cancer screening not indicated DXA ordered today tyring to get repeat one year with no medication and risk of rebound fracture stopping prolia . To run PA for evenity or prolia  with Sherline Distel.  Really stressed the need to treat and to stay consistent with the medication Labs and immunizations ordered today Discussed breast self exams Encouraged healthy lifestyle practices Encouraged Vit D and Calcium   No follow-ups on file.  Reinaldo Caras

## 2023-11-12 ENCOUNTER — Ambulatory Visit: Payer: Self-pay | Admitting: Obstetrics and Gynecology

## 2023-11-12 LAB — COMPREHENSIVE METABOLIC PANEL WITH GFR
AG Ratio: 1.7 (calc) (ref 1.0–2.5)
ALT: 11 U/L (ref 6–29)
AST: 16 U/L (ref 10–35)
Albumin: 4.4 g/dL (ref 3.6–5.1)
Alkaline phosphatase (APISO): 68 U/L (ref 37–153)
BUN: 16 mg/dL (ref 7–25)
CO2: 28 mmol/L (ref 20–32)
Calcium: 9.7 mg/dL (ref 8.6–10.4)
Chloride: 103 mmol/L (ref 98–110)
Creat: 0.7 mg/dL (ref 0.60–1.00)
Globulin: 2.6 g/dL (ref 1.9–3.7)
Glucose, Bld: 97 mg/dL (ref 65–99)
Potassium: 3.7 mmol/L (ref 3.5–5.3)
Sodium: 139 mmol/L (ref 135–146)
Total Bilirubin: 0.4 mg/dL (ref 0.2–1.2)
Total Protein: 7 g/dL (ref 6.1–8.1)
eGFR: 90 mL/min/{1.73_m2} (ref >=60)

## 2023-11-12 LAB — VITAMIN D 25 HYDROXY (VIT D DEFICIENCY, FRACTURES): Vit D, 25-Hydroxy: 30 ng/mL (ref 30–100)

## 2023-11-12 LAB — TSH: TSH: 2.67 m[IU]/L (ref 0.40–4.50)

## 2023-11-17 ENCOUNTER — Encounter: Payer: Self-pay | Admitting: Obstetrics and Gynecology

## 2024-01-17 NOTE — Progress Notes (Addendum)
 OFFICE NOTE:    Date:  01/18/2024  ID:  Carla Alvarado, DOB 1945-10-27, MRN [id number] PCP: Carla Senior, MD  [facility] Providers Cardiologist:  None        Hyperlipidemia  Intolerant of atorvastatin (myalgias) MPI 10/17/14: no ischemia  GERD Hypothyroidism  Aotic atherosclerosis       Discussed the use of AI scribe software for clinical note transcription with the patient, who gave verbal consent to proceed. History of Present Illness Carla Alvarado is a 78 y.o. female referred by Carla Senior, MD for evaluation of chest pain.   Over a month ago, she experienced an episode of jaw pain radiating to her neck, accompanied by dizziness, while standing. The pain was described as 'really bad indigestion' and lasted about 15 minutes. She took two low-dose aspirin  and laid down, which alleviated the symptoms. There was no exertional activity at the time, and she has not had similar episodes since.  She experiences occasional palpitations, described as 'flutters' in her chest, which are uncomfortable rather than painful and can last up to an hour. These occur both during activity and while lying in bed at night. She feels weak when walking distances or climbing stairs but denies chest discomfort during these activities. No episodes of syncope, diaphoresis, nausea, or dyspnea.  She is under a lot of stress.  Her son has stage IV throat cancer.  She does not smoke, uses alcohol occasionally, and denies drug use. 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 She is divorced with two sons, aged 43 and 52. She reports significant stress related to her son's health issues, impacting her eating habits and leading to weight loss.  Her family history includes her mother having hypertension.      ROS-See HPI    Studies Reviewed:  EKG Interpretation Date/Time:  Tuesday January 18 2024 08:20:59 EDT Ventricular Rate:  63 PR  Interval:  128 QRS Duration:  80 QT Interval:  386 QTC Calculation: 395 R Axis:   38  Text Interpretation: Normal sinus rhythm Normal ECG No significant change since last tracing Confirmed by Lelon Hamilton [id number]) on 01/18/2024 8:58:45 AM   Labs from PCP -personally reviewed and interpreted by me today 01/18/2024 01/19/23: Hgb 12.8, TSH 1.04 06/24/22: ALT 14, TC 232, Trig 114, LDL 142, HDL 70  Recent Labs    11/11/23 1420  K 3.7  CREATININE 0.70  ALT 11           Physical Exam:  VS:  BP 110/60   Pulse 63   Ht 4' 10.5 (1.486 m)   Wt 102 lb 9.6 oz (46.5 kg)   SpO2 97%   BMI 21.08 kg/m        Wt Readings from Last 3 Encounters:  01/18/24 102 lb 9.6 oz (46.5 kg)  11/11/23 105 lb (47.6 kg)  05/06/23 106 lb (48.1 kg)    Constitutional:      Appearance: Healthy appearance. Not in distress.  Neck:     Vascular: No carotid bruit or JVR. JVD normal.  Pulmonary:     Breath sounds: Normal breath sounds. No wheezing. No rales.  Cardiovascular:     Normal rate. Regular rhythm.     Murmurs: There is no murmur.  Edema:    Peripheral edema absent.  Abdominal:     Palpations: Abdomen is soft.  Skin:    General: Skin is warm and dry.  Assessment and Plan:    Assessment & Plan Precordial chest pain She notes symptoms of throat discomfort about a month ago that lasted about 15 minutes.  She felt poorly with this and was concerned she needed to call EMS.  Her symptoms then resolved.  She also notes intermittent left-sided chest discomfort described as flutters and pressure.  She has not specifically noted symptoms with exertion but she is under a lot of emotional stress with her son's illness and states that she is always active.  Her chest symptoms are somewhat suggestive of ischemia and possibly cardiac.  Her EKG does not demonstrate any acute changes.  She does have some risk factors for CAD including aortic atherosclerosis and hyperlipidemia. She was interested in a CAC score.  However, that will not rule out ischemic heart disease. I have recommended proceeding with coronary CTA and echocardiogram. - Arrange CCTA to rule out ischemic heart disease - Arrange echocardiogram to rule out structural heart disease - Metoprolol  tartrate 50 mg prior to CCTA - Follow-up after testing Hypercholesteremia Most recent LDL from January 2024 was 857.  If she has significant coronary artery calcification on CT, would aim for an LDL less than 70. She has intolerance to atorvastatin in the past.   - Continue lovastatin 40 mg daily if more aggressive control of lipids are needed, consider addition of ezetimibe.        Dispo:  Return in about 10 weeks (around 03/28/2024) for Follow up after testing, w/ Glendia Ferrier, PA-C.  Signed, Glendia Ferrier, PA-C

## 2024-01-18 ENCOUNTER — Encounter: Payer: Self-pay | Admitting: Physician Assistant

## 2024-01-18 ENCOUNTER — Ambulatory Visit: Attending: Physician Assistant | Admitting: Physician Assistant

## 2024-01-18 VITALS — BP 110/60 | HR 63 | Ht 58.5 in | Wt 102.6 lb

## 2024-01-18 DIAGNOSIS — R072 Precordial pain: Secondary | ICD-10-CM

## 2024-01-18 DIAGNOSIS — E7800 Pure hypercholesterolemia, unspecified: Secondary | ICD-10-CM

## 2024-01-18 LAB — BASIC METABOLIC PANEL WITH GFR
BUN/Creatinine Ratio: 14 (ref 12–28)
BUN: 10 mg/dL (ref 8–27)
CO2: 23 mmol/L (ref 20–29)
Calcium: 9.9 mg/dL (ref 8.7–10.3)
Chloride: 100 mmol/L (ref 96–106)
Creatinine, Ser: 0.69 mg/dL (ref 0.57–1.00)
Glucose: 96 mg/dL (ref 70–99)
Potassium: 4.6 mmol/L (ref 3.5–5.2)
Sodium: 141 mmol/L (ref 134–144)
eGFR: 90 mL/min/1.73 (ref >59)

## 2024-01-18 MED ORDER — METOPROLOL TARTRATE 50 MG PO TABS: ORAL_TABLET | ORAL | 0 refills | Status: DC

## 2024-01-18 NOTE — Assessment & Plan Note (Signed)
 Most recent LDL from January 2024 was 857.  If she has significant coronary artery calcification on CT, would aim for an LDL less than 70. She has intolerance to atorvastatin in the past.   - Continue lovastatin 40 mg daily if more aggressive control of lipids are needed, consider addition of ezetimibe.

## 2024-01-18 NOTE — Patient Instructions (Signed)
 Medication Instructions:  Your physician recommends that you continue on your current medications as directed. Please refer to the Current Medication list given to you today.  *If you need a refill on your cardiac medications before your next appointment, please call your pharmacy*  Lab Work: TODAY:  BMET  If you have labs (blood work) drawn today and your tests are completely normal, you will receive your results only by: MyChart Message (if you have MyChart) OR A paper copy in the mail If you have any lab test that is abnormal or we need to change your treatment, we will call you to review the results.  Testing/Procedures: Your physician has requested that you have an echocardiogram. Echocardiography is a painless test that uses sound waves to create images of your heart. It provides your doctor with information about the size and shape of your heart and how well your heart's chambers and valves are working. This procedure takes approximately one hour. There are no restrictions for this procedure. Please do NOT wear cologne, perfume, aftershave, or lotions (deodorant is allowed). Please arrive 15 minutes prior to your appointment time.  Please note: We ask at that you not bring children with you during ultrasound (echo/ vascular) testing. Due to room size and safety concerns, children are not allowed in the ultrasound rooms during exams. Our front office staff cannot provide observation of children in our lobby area while testing is being conducted. An adult accompanying a patient to their appointment will only be allowed in the ultrasound room at the discretion of the ultrasound technician under special circumstances. We apologize for any inconvenience.    Your physician has requested that you have cardiac CT. Cardiac computed tomography (CT) is a painless test that uses an x-ray machine to take clear, detailed pictures of your heart. For further information please visit [email or web address]  Please follow instruction sheet BELOW:    Your cardiac CT will be scheduled at one of the below locations:   [facility] [address] [place], [place] [address] [phone]  OR   [facility] [address] [place], [place] [address] [phone]  OR   [facility] [address] [place], [place] [address] [phone]  OR   [facility] and Vascular Tower [address]  [place], [place] [address]  OR   [facility] [address] [place], [place]  If scheduled at [facility], please arrive at the [facility] and Children's Entrance (Entrance C2) of [facility] 30 minutes prior to test start time. You can use the FREE valet parking offered at entrance C (encouraged to control the heart rate for the test)  Proceed to the [facility] Radiology Department (first floor) to check-in and test prep.  All radiology patients and guests should use entrance C2 at [facility], accessed from [facility], even though the hospital's physical address listed is [address].  If scheduled at the Heart and Vascular Tower at [address], please enter the parking lot using the [address] entrance and use the FREE valet service at the patient drop-off area. Enter the buidling and check-in with registration on the main floor.  If scheduled at [facility] or [facility], please arrive 15 mins early for check-in and test prep.  There is spacious parking and easy access to the radiology department from the [facility] Heart and Vascular entrance. Please enter here and check-in with  the desk attendant.   If scheduled at [facility], please arrive 30 minutes early for check-in and test prep.  Please follow these instructions carefully (unless otherwise directed):  An IV will be required for this test and Nitroglycerin will be  given.  Hold all erectile dysfunction medications at least 3 days (72 hrs) prior to test. (Ie viagra, cialis, sildenafil, tadalafil, etc)   On the Night Before the Test: Be sure to Drink plenty of water. Do not consume any caffeinated/decaffeinated beverages or chocolate 12 hours prior to your test. Do not take any antihistamines 12 hours prior to your test.   On the Day of the Test: Drink plenty of water until 1 hour prior to the test. Do not eat any food 1 hour prior to test. You may take your regular medications prior to the test.  Take metoprolol  (Lopressor ) 50 MG two hours prior to test. THIS HAS BEEN SENT TO [facility]- please wear underwire-free bra if available, avoid dresses & tight clothing      After the Test: Drink plenty of water. After receiving IV contrast, you may experience a mild flushed feeling. This is normal. On occasion, you may experience a mild rash up to 24 hours after the test. This is not dangerous. If this occurs, you can take Benadryl 25 mg, Zyrtec, Claritin, or Allegra and increase your fluid intake. (Patients taking Tikosyn should avoid Benadryl, and may take Zyrtec, Claritin, or Allegra) If you experience trouble breathing, this can be serious. If it is severe call 911 IMMEDIATELY. If it is mild, please call our office.  We will call to schedule your test 2-4 weeks out understanding that some insurance companies will need an authorization prior to the service being performed.   For more information and frequently asked questions, please visit our website : [email or web address]  For non-scheduling related questions, please contact the cardiac imaging nurse navigator should you have any questions/concerns: Cardiac Imaging Nurse Navigators Direct Office Dial: [phone]   For scheduling needs, including cancellations and rescheduling, please call [place], [phone].   Follow-Up: At [facility], you and your health needs are  our priority.  As part of our continuing mission to provide you with exceptional heart care, our providers are all part of one team.  This team includes your primary Cardiologist (physician) and Advanced Practice Providers or APPs (Physician Assistants and Nurse Practitioners) who all work together to provide you with the care you need, when you need it.  Your next appointment:   10 week(s)  Provider:   Glendia Ferrier, PA-C          We recommend signing up for the patient portal called MyChart.  Sign up information is provided on this After Visit Summary.  MyChart is used to connect with patients for Virtual Visits (Telemedicine).  Patients are able to view lab/test results, encounter notes, upcoming appointments, etc.  Non-urgent messages can be sent to your provider as well.   To learn more about what you can do with MyChart, go to [email or web address].   Other Instructions

## 2024-01-19 ENCOUNTER — Ambulatory Visit: Payer: Self-pay | Admitting: Physician Assistant

## 2024-01-21 ENCOUNTER — Encounter: Payer: Self-pay | Admitting: Family Medicine

## 2024-01-24 ENCOUNTER — Telehealth (HOSPITAL_COMMUNITY): Payer: Self-pay | Admitting: *Deleted

## 2024-01-24 ENCOUNTER — Telehealth: Payer: Self-pay | Admitting: *Deleted

## 2024-01-24 NOTE — Telephone Encounter (Signed)
 Call to patient. Advised following up on if she had decided to proceed with Evenity or Prolia , patient states she would like to proceed with Evenity, but having a CT scan of the heart tomorrow to check her arteries and then will have an Echocardiogram in September. Patient asking if she should wait until after cardiac workup to begin Evenity. RN advised would review with Dr. Glennon and return call. Patient agreeable.   Routing to provider for review.

## 2024-01-24 NOTE — Telephone Encounter (Signed)
 Call to patient. Message given to patient as seen below from Dr. Glennon and patient verbalized understanding. Patient states that she would like to wait to proceed with Evenity until she has the CT scan tomorrow and then will call back. RN provided direct line for [employer] in the office.

## 2024-01-24 NOTE — Telephone Encounter (Signed)
 Reaching out to patient to offer assistance regarding upcoming cardiac imaging study; pt verbalizes understanding of appt date/time, parking situation and where to check in, pre-test NPO status and medications ordered, and verified current allergies; name and call back number provided for further questions should they arise  Larey Brick RN Navigator Cardiac Imaging [facility] [phone] office [phone] cell

## 2024-01-25 ENCOUNTER — Ambulatory Visit (HOSPITAL_COMMUNITY)
Admission: RE | Admit: 2024-01-25 | Discharge: 2024-01-25 | Disposition: A | Discharge status: Home / Self Care | Source: Ambulatory Visit | Attending: Physician Assistant | Admitting: Physician Assistant

## 2024-01-25 DIAGNOSIS — I2510 Atherosclerotic heart disease of native coronary artery without angina pectoris: Secondary | ICD-10-CM | POA: Diagnosis not present

## 2024-01-25 DIAGNOSIS — R072 Precordial pain: Secondary | ICD-10-CM | POA: Diagnosis not present

## 2024-01-25 DIAGNOSIS — E7800 Pure hypercholesterolemia, unspecified: Secondary | ICD-10-CM | POA: Diagnosis not present

## 2024-01-25 MED ORDER — IOHEXOL 350 MG/ML SOLN
100.0000 mL | Freq: Once | INTRAVENOUS | Status: AC | PRN
  Administered 2024-01-25: 100 mL via INTRAVENOUS

## 2024-01-25 MED ORDER — NITROGLYCERIN 0.4 MG SL SUBL
0.8000 mg | SUBLINGUAL_TABLET | Freq: Once | SUBLINGUAL | Status: AC
  Administered 2024-01-25: 0.8 mg via SUBLINGUAL

## 2024-01-26 ENCOUNTER — Encounter: Payer: Self-pay | Admitting: Physician Assistant

## 2024-01-26 DIAGNOSIS — I2510 Atherosclerotic heart disease of native coronary artery without angina pectoris: Secondary | ICD-10-CM | POA: Insufficient documentation

## 2024-01-28 ENCOUNTER — Ambulatory Visit: Payer: Self-pay | Admitting: Physician Assistant

## 2024-01-31 NOTE — Progress Notes (Signed)
 Pt has been made aware of normal result and verbalized understanding.  jw

## 2024-02-06 ENCOUNTER — Emergency Department (HOSPITAL_BASED_OUTPATIENT_CLINIC_OR_DEPARTMENT_OTHER): Admission: EM | Admit: 2024-02-06 | Discharge: 2024-02-07 | Disposition: A | Discharge status: Home / Self Care

## 2024-02-06 ENCOUNTER — Other Ambulatory Visit: Payer: Self-pay

## 2024-02-06 ENCOUNTER — Encounter (HOSPITAL_BASED_OUTPATIENT_CLINIC_OR_DEPARTMENT_OTHER): Payer: Self-pay | Admitting: Emergency Medicine

## 2024-02-06 ENCOUNTER — Emergency Department (HOSPITAL_BASED_OUTPATIENT_CLINIC_OR_DEPARTMENT_OTHER)

## 2024-02-06 DIAGNOSIS — R072 Precordial pain: Secondary | ICD-10-CM | POA: Insufficient documentation

## 2024-02-06 DIAGNOSIS — M549 Dorsalgia, unspecified: Secondary | ICD-10-CM | POA: Diagnosis not present

## 2024-02-06 DIAGNOSIS — M25511 Pain in right shoulder: Secondary | ICD-10-CM | POA: Diagnosis present

## 2024-02-06 DIAGNOSIS — R079 Chest pain, unspecified: Secondary | ICD-10-CM

## 2024-02-06 LAB — BASIC METABOLIC PANEL WITH GFR
Anion gap: 12 (ref 5–15)
BUN: 13 mg/dL (ref 8–23)
CO2: 23 mmol/L (ref 22–32)
Calcium: 9.6 mg/dL (ref 8.9–10.3)
Chloride: 107 mmol/L (ref 98–111)
Creatinine, Ser: 0.59 mg/dL (ref 0.44–1.00)
GFR, Estimated: >60 mL/min (ref >60)
Glucose, Bld: 92 mg/dL (ref 70–99)
Potassium: 4.1 mmol/L (ref 3.5–5.1)
Sodium: 141 mmol/L (ref 135–145)

## 2024-02-06 LAB — D-DIMER, QUANTITATIVE: D-Dimer, Quant: <0.27 ug{FEU}/mL (ref 0.00–0.50)

## 2024-02-06 LAB — CBC
HCT: 38.9 % (ref 36.0–46.0)
Hemoglobin: 13.1 g/dL (ref 12.0–15.0)
MCH: 31.6 pg (ref 26.0–34.0)
MCHC: 33.7 g/dL (ref 30.0–36.0)
MCV: 93.7 fL (ref 80.0–100.0)
Platelets: 299 K/uL (ref 150–400)
RBC: 4.15 MIL/uL (ref 3.87–5.11)
RDW: 11.5 % (ref 11.5–15.5)
WBC: 6.1 K/uL (ref 4.0–10.5)
nRBC: 0 % (ref 0.0–0.2)

## 2024-02-06 LAB — MAGNESIUM: Magnesium: 2.3 mg/dL (ref 1.7–2.4)

## 2024-02-06 LAB — HEPATIC FUNCTION PANEL
ALT: 11 U/L (ref 0–44)
AST: 18 U/L (ref 15–41)
Albumin: 4.4 g/dL (ref 3.5–5.0)
Alkaline Phosphatase: 72 U/L (ref 38–126)
Bilirubin, Direct: 0.2 mg/dL (ref 0.0–0.2)
Indirect Bilirubin: 0.1 mg/dL — ABNORMAL LOW (ref 0.3–0.9)
Total Bilirubin: 0.3 mg/dL (ref 0.0–1.2)
Total Protein: 7 g/dL (ref 6.5–8.1)

## 2024-02-06 LAB — TROPONIN T, HIGH SENSITIVITY
Troponin T High Sensitivity: <15 ng/L (ref 0–19)
Troponin T High Sensitivity: <15 ng/L (ref 0–19)

## 2024-02-06 MED ORDER — KETOROLAC TROMETHAMINE 30 MG/ML IJ SOLN
15.0000 mg | Freq: Once | INTRAMUSCULAR | Status: AC
  Administered 2024-02-07: 15 mg via INTRAVENOUS
  Filled 2024-02-06: qty 1

## 2024-02-06 MED ORDER — IOHEXOL 350 MG/ML SOLN
100.0000 mL | Freq: Once | INTRAVENOUS | Status: AC | PRN
  Administered 2024-02-06: 100 mL via INTRAVENOUS

## 2024-02-06 MED ORDER — IOHEXOL 350 MG/ML SOLN: 75.0000 mL | Freq: Once | INTRAVENOUS | Status: DC | PRN

## 2024-02-06 NOTE — Discharge Instructions (Addendum)
 Your workup today was unremarkable.   I do think you should follow up with your pimrary care physician.   This very well could be more musculoskeletal.  Continue with the Tylenol .  Continue with the lidocaine patches.  Try naproxen  and flexeril  if pain not controlled. There is no obvious life-threatening disease process at this point in time.   You had some sinus arrythmia on cardiac tele. This is not what is driving your pain. You can follow up with cardiology regarding this.  If you ever have any near syncopal or lightheaded events to please come to the ED.

## 2024-02-06 NOTE — ED Provider Notes (Signed)
 [facility] Provider Note   CSN: [id number] Arrival date & time: 02/06/24  1944     Patient presents with: Chest Pain   Carla Alvarado is a 78 y.o. female.    Chest Pain  Patient presents because of right shoulder pain as well as right scapular pain.  Patient has been present since about Friday.  No obvious exacerbating or alleviating factors.  She currently has a lidocaine patch over her right scapula/shoulder area.  Patient states that she maybe felt short of breath a couple days ago but otherwise no clear chest pain.  No hemoptysis.  No history of DVT or PE.  No unilateral leg swelling.  No recent travel history.  No cancer history.  Patient denies any kind of exertional chest pain that she is aware of.    Previous medical history reviewed : Patient followed up with cardiology.  Last saw cardiology in July.  Was seen because of precordial chest pain.  Had a coronary calcium score of 14 with a CT coronary.  Only third 33rd percentile.  Low risk.   Prior to Admission medications   Medication Sig Start Date End Date Taking? Authorizing Provider  Apoaequorin (PREVAGEN PO) Take by mouth.    [provider]  clobetasol  ointment (TEMOVATE ) 0.05 % PLEASE SEE ATTACHED FOR DETAILED DIRECTIONS    [provider]  diazepam (VALIUM) 2 MG tablet Take 2 mg by mouth at bedtime as needed for anxiety.    [provider]  Ferrous Fumarate (IRON) 18 MG TBCR Take by mouth.    [provider]  Ibuprofen (ADVIL) 200 MG CAPS     [provider]  levothyroxine (SYNTHROID, LEVOTHROID) 88 MCG tablet Take 88 mcg by mouth daily before breakfast.    [provider]  lovastatin (MEVACOR) 40 MG tablet Take 40 mg by mouth daily. 07/08/21   [provider]  MAGNESIUM GLYCINATE PO Take 200 mg by mouth daily.    [provider]  metoprolol  tartrate (LOPRESSOR ) 50 MG tablet TAKE 1 TABLET BY MOUTH 2  HOURS PRIOR TO THE CARDIAC CT 01/18/24   Lelon Hamilton T, PA-C  Multiple Vitamin (MULTIVITAMIN PO) Take by mouth.    [provider]  Multiple Vitamins-Minerals (HAIR SKIN AND NAILS FORMULA PO) Take by mouth.    [provider]  NONFORMULARY OR COMPOUNDED ITEM Estradiol  vaginal cream 0.02% insert twice weekly 07/29/23   Amundson C Silva, Brook E, MD  OVER THE COUNTER MEDICATION Calcium 600 mg, one capsule daily.    [provider]  OVER THE COUNTER MEDICATION Vitamin D  3, one capsule daily.    [provider]  OVER THE COUNTER MEDICATION Vitamin B 12, 2000 mcg. One tablet daily.    [provider]  SUMAtriptan (IMITREX) 100 MG tablet Take 100 mg by mouth every 2 (two) hours as needed for migraine. May repeat in 2 hours if headache persists or recurs.    [provider]  Zinc 100 MG TABS 1 tablet Orally Once a day for 30 day(s)    [provider]  zolpidem (AMBIEN) 5 MG tablet Take 5 mg by mouth at bedtime as needed for sleep.    [provider]    Allergies: Morphine  and codeine, Atorvastatin, Levothyroxine sodium, and Sertraline hcl    Review of Systems  Cardiovascular:  Positive for chest pain.    Updated Vital Signs BP 129/84   Pulse 69   Temp 98.7 F (  37.1 C) (Oral)   Resp 12   Ht 4' 10.5 (1.486 m)   Wt 47.8 kg   SpO2 98%   BMI 21.63 kg/m   Physical Exam Vitals and nursing note reviewed.  Constitutional:      General: She is not in acute distress.    Appearance: She is well-developed.  HENT:     Head: Normocephalic and atraumatic.  Eyes:     Conjunctiva/sclera: Conjunctivae normal.  Cardiovascular:     Rate and Rhythm: Normal rate and regular rhythm.     Heart sounds: No murmur heard. Pulmonary:     Effort: Pulmonary effort is normal. No respiratory distress.     Breath sounds: Normal breath sounds.  Abdominal:     Palpations: Abdomen is soft.     Tenderness: There is no abdominal tenderness.   Musculoskeletal:        General: No swelling.     Cervical back: Neck supple.  Skin:    General: Skin is warm and dry.     Capillary Refill: Capillary refill takes less than 2 seconds.  Neurological:     Mental Status: She is alert.  Psychiatric:        Mood and Affect: Mood normal.     (all labs ordered are listed, but only abnormal results are displayed) Labs Reviewed  HEPATIC FUNCTION PANEL - Abnormal; Notable for the following components:      Result Value   Indirect Bilirubin 0.1 (*)    All other components within normal limits  BASIC METABOLIC PANEL WITH GFR  CBC  MAGNESIUM  D-DIMER, QUANTITATIVE  TROPONIN T, HIGH SENSITIVITY  TROPONIN T, HIGH SENSITIVITY    EKG: EKG Interpretation Date/Time:  Sunday February 06 2024 19:55:45 EDT Ventricular Rate:  71 PR Interval:  142 QRS Duration:  92 QT Interval:  383 QTC Calculation: 417 R Axis:   66  Text Interpretation: Sinus arrythmia Confirmed by Simon Rea [phone]) on 02/06/2024 8:11:57 PM  Radiology: [facility] Chest 2 View Result Date: 02/06/2024 CLINICAL DATA:  Chest pain on the right with radiation to the arm and back EXAM: CHEST - 2 VIEW COMPARISON:  11/26/2020 FINDINGS: Stable cardiomediastinal silhouette. Aortic atherosclerotic calcification. Biapical pleuroparenchymal scarring. No focal consolidation, pleural effusion, or pneumothorax. No displaced rib fractures. IMPRESSION: No active cardiopulmonary disease. Electronically Signed   By: Norman Gatlin M.D.   On: 02/06/2024 20:51     Procedures   Medications Ordered in the ED  iohexol  (OMNIPAQUE ) 350 MG/ML injection 100 mL (has no administration in time range)                                    Medical Decision Making Amount and/or Complexity of Data Reviewed Labs: ordered. Radiology: ordered.  Risk Prescription drug management.    Patient presents because of right shoulder pain as well as right scapular pain.  Patient has been present since about Friday.   No obvious exacerbating or alleviating factors.  She currently has a lidocaine patch over her right scapula/shoulder area.  Patient states that she maybe felt short of breath a couple days ago but otherwise no clear chest pain.  No hemoptysis.  No history of DVT or PE.  No unilateral leg swelling.  No recent travel history.  No cancer history.  Patient denies any kind of exertional chest pain that she is aware of.    Previous medical history reviewed : Patient followed  up with cardiology.  Last saw cardiology in July.  Was seen because of precordial chest pain.  Had a coronary calcium score of 14 with a CT coronary.  Only third 33rd percentile.  Low risk.   In terms of cardiac pathology, as above, recent unremarkable CT coronary scan.  Reassuring.  EKG here sinus rhythm.  No STEMI.  Troponin x 2 unremarkable.  Given low risk calcium score as well as negative troponin x 2 normal EKG, she can follow-up with cardiology outpatient.  She did have some episodes of sinus arrhythmia.  A few sinus pauses.  No evidence any count of heart block.  This is interpreted on cardiac telemetry.  Asymptomatic from the standpoint time.  No lightheadedness or near syncope.  This can be followed up by cardiology.   Did obtain D-dimer given cannot apply the PERC rule to the patient.  D-dimer unremarkable.  Low risk for PE at this point in time.  No hemoptysis.  No recent travel history.  No leg swelling or pain.  No history of DVT or PE.  No tachypnea or tachycardia here O2 saturation 98% room air.  No further CTA workup needed.   Chest x-ray showed no evidence of pneumothorax.  No infiltrate   Patient has a soft benign abdomen.  No rebound guarding or tenderness.  Negative Murphy sign.  No concern for referred pain from diaphragmatic irritation.   Upon reexamination, patient complained about more midline back pain with some slight radiation to her chest.  Therefore, we will obtain CT of the chest to rule out any kind  of dissection process.  Signed out pending CTA of the chest.   Likely musculoskeletal.  Somewhat reproducible.  Described as a dull ache.  Recommended Tylenol .  She already has a lidocaine patch normal.      Final diagnoses:  Chest pain, unspecified type  Acute right-sided back pain, unspecified back location    ED Discharge Orders     None          Simon Lavonia SAILOR, MD 02/06/24 [phone]

## 2024-02-06 NOTE — ED Triage Notes (Signed)
 Pt c/o right sided chest pain with radiation to right arm and into back with nausea x 2 days

## 2024-02-06 NOTE — ED Provider Notes (Signed)
 Care assumed at shift change. Patient here with chest/back pain. Cardiac workup neg. Pending CTA for dissection.  Physical Exam  BP 129/84   Pulse 69   Temp 98.7 F (37.1 C) (Oral)   Resp 12   Ht 4' 10.5 (1.486 m)   Wt 47.8 kg   SpO2 98%   BMI 21.63 kg/m   Physical Exam  Procedures  Procedures  ED Course / MDM   Clinical Course as of 02/07/24 0044  Mon Feb 07, 2024  0009 I personally viewed the images from radiology studies and agree with radiologist interpretation: CTA neg. Toradol  for pain [CS]  0042 Patient reports some improvement in pain. Able to ambulate to bathroom without difficulty. Suspect MSK pain. Will discharge with Rx for naprosyn , flexeril  (advised not to mix with other sedating meds), PCP follow up, RTED for any other concerns.   [CS]    Clinical Course User Index [CS] Roselyn Carlin NOVAK, MD   Medical Decision Making Amount and/or Complexity of Data Reviewed Radiology: independent interpretation performed. Decision-making details documented in ED Course.  Risk Prescription drug management.          Roselyn Carlin NOVAK, MD 02/07/24 [phone]

## 2024-02-07 MED ORDER — NAPROXEN 375 MG PO TABS: 375.0000 mg | ORAL_TABLET | Freq: Two times a day (BID) | ORAL | 0 refills | Status: AC

## 2024-02-07 MED ORDER — CYCLOBENZAPRINE HCL 5 MG PO TABS: 5.0000 mg | ORAL_TABLET | Freq: Two times a day (BID) | ORAL | 0 refills | Status: AC | PRN

## 2024-02-23 ENCOUNTER — Ambulatory Visit (HOSPITAL_COMMUNITY)
Admission: RE | Admit: 2024-02-23 | Discharge: 2024-02-23 | Disposition: A | Discharge status: Home / Self Care | Source: Ambulatory Visit | Attending: Internal Medicine | Admitting: Internal Medicine

## 2024-02-23 DIAGNOSIS — E7800 Pure hypercholesterolemia, unspecified: Secondary | ICD-10-CM | POA: Diagnosis present

## 2024-02-23 DIAGNOSIS — R072 Precordial pain: Secondary | ICD-10-CM | POA: Diagnosis not present

## 2024-02-23 LAB — ECHOCARDIOGRAM COMPLETE
Area-P 1/2: 3.53 cm2
S' Lateral: 2.5 cm

## 2024-02-24 ENCOUNTER — Encounter: Payer: Self-pay | Admitting: Physician Assistant

## 2024-02-24 DIAGNOSIS — I071 Rheumatic tricuspid insufficiency: Secondary | ICD-10-CM | POA: Insufficient documentation

## 2024-02-24 DIAGNOSIS — I340 Nonrheumatic mitral (valve) insufficiency: Secondary | ICD-10-CM | POA: Insufficient documentation

## 2024-03-26 NOTE — Assessment & Plan Note (Signed)
 Goal LDL < 70. Her last LDL was 142. ***

## 2024-03-26 NOTE — Progress Notes (Signed)
 OFFICE NOTE:    Date:  03/27/2024  ID:  MAEVA DANT, DOB January 21, 1946, MRN [id number] PCP: Nanci Senior, MD  [facility] Providers Cardiologist:  Emeline FORBES Calender, MD Cardiology APP:  Lelon Hamilton T, PA-C        Coronary artery disease  CCTA 01/25/2024: CAC score 14.9 (33rd percentile), nonobstructive CAD [LAD 1-24, LCx 1-24] TTE 02/23/24: EF 60-65, no RWMA, NL RVSF, NL PASP, mild MR, mild to mod TR Hyperlipidemia  Intolerant of atorvastatin (myalgias) GERD Hypothyroidism  Aortic atherosclerosis       Discussed the use of AI scribe software for clinical note transcription with the patient, who gave verbal consent to proceed. History of Present Illness Carla Alvarado is a 78 y.o. female who returns for follow up of CAD. She was evaluated in July for chest pain.  Coronary CTA demonstrated minimal nonobstructive CAD.  Echocardiogram demonstrated normal ejection fraction, mild MR, mild to moderate TR.  She has not experienced further episodes of chest pain similar to the initial event. Recently, her sister's blood pressure monitor indicated atrial fibrillation two days ago, but she has not had an EKG to confirm this. She describes occasional heart palpitations, described as 'fluttering,' which is a new symptom. Her blood pressure was noted to be 140 mmHg, higher than her usual 115/75 mmHg. She reports significant stress due to her son's severe illness. No recent episodes of chest discomfort similar to the initial event, no shortness of breath, no leg swelling, and no syncope.  She has had some tingling in her right hand at times.  This sounds noncardiac and I have asked her to follow-up with primary care to evaluate this further.    ROS-See HPI    Studies Reviewed:  EKG Interpretation Date/Time:  Monday March 27 2024 16:11:27 EDT Ventricular Rate:  64 PR Interval:  140 QRS Duration:  86 QT Interval:  392 QTC Calculation: 404 R Axis:   50  Text  Interpretation: Sinus rhythm with marked sinus arrhythmia No significant change since last tracing Confirmed by Lelon Hamilton [phone]) on 03/27/2024 4:13:46 PM   Results LABS Total cholesterol: 226 (01/2024) HDL: 73 (01/2024) LDL: 122 (01/2024) Triglycerides: 177 (01/2024) Hemoglobin: 13.1 Creatinine: 0.59 Potassium: 4.1 ALT: 11   HYPERTENSION CONTROL Vitals:   03/27/24 1519 03/27/24 1606  BP: (!) 140/72 (!) 146/80    The patient's blood pressure is elevated above target today.  In order to address the patient's elevated BP: Blood pressure will be monitored at home to determine if medication changes need to be made.         Physical Exam:  VS:  BP (!) 146/80   Pulse 77   Ht 4' 10 (1.473 m)   Wt 105 lb (47.6 kg)   SpO2 98%   BMI 21.95 kg/m        Wt Readings from Last 3 Encounters:  03/27/24 105 lb (47.6 kg)  02/06/24 105 lb 4.8 oz (47.8 kg)  01/18/24 102 lb 9.6 oz (46.5 kg)    Constitutional:      Appearance: Healthy appearance. Not in distress.  Neck:     Vascular: JVD normal.  Pulmonary:     Breath sounds: Normal breath sounds. No wheezing. No rales.  Cardiovascular:     Normal rate. Irregular rhythm.     Murmurs: There is no murmur.  Edema:    Peripheral edema absent.  Abdominal:     Palpations: Abdomen is soft.  Assessment and Plan:    Assessment & Plan Coronary artery disease involving native coronary artery of native heart without angina pectoris Mild nonobstructive CAD on recent CCTA. She does not require ASA for her CAD but with aortic atherosclerosis she should be on it. Goal LDL < 70. She is not having any chest pain to suggest angina.  -Take ASA 81 mg once daily  -Continue Lovastatin 40 mg once daily  -Follow up 6 mos Pure hypercholesterolemia Goal LDL < 70. Her last LDL was 122. She was intol of Atorvastatin. -Continue Lovastatin 40 mg once daily  -Start Zetia 10 mg once daily.  -Fasting Lipids, ALT 3 mos Nonrheumatic tricuspid valve  regurgitation Nonrheumatic mitral valve regurgitation Mild MR and mild to mod TR on recent echocardiogram. There was normal RVF and normal PASP.  -Plan repeat TTE in 2-3 years.  Palpitations She is concerned she has atrial fibrillation. EKG today demonstrated NSR.  She does have sinus arrhythmia noted.  Given her palpitations and questionable documentation of atrial fibrillation recently, we will arrange event monitor. -Zio XT x 14 days to further evaluate palpitations  Elevated blood pressure reading Her BP is above target. She is under a lot of stress with her son who is very ill. Question if this is causing her BP to increase. Will monitor for 1 week. She will send readings via MyChart. If BP above target, will need to start medication. Would consider beta-blocker vs ACE.         Dispo:  Return in about 6 months (around 09/25/2024) for Routine Follow Up w Dr. Kriste.  Signed, Glendia Ferrier, PA-C

## 2024-03-26 NOTE — Assessment & Plan Note (Signed)
 Mild nonobstructive CAD on recent CCTA. She does not require ASA for her CAD but with aortic atherosclerosis she should be on it. Goal LDL < 70. She is not having any chest pain to suggest angina.  -Take ASA 81 mg once daily  -Continue Lovastatin 40 mg once daily  -Follow up 6 mos

## 2024-03-26 NOTE — Assessment & Plan Note (Signed)
 Mild MR and mild to mod TR on recent echocardiogram. There was normal RVF and normal PASP. Plan repeat TTE in 2-3 years.***

## 2024-03-27 ENCOUNTER — Other Ambulatory Visit (HOSPITAL_COMMUNITY): Payer: Self-pay

## 2024-03-27 ENCOUNTER — Ambulatory Visit: Admitting: Physician Assistant

## 2024-03-27 ENCOUNTER — Ambulatory Visit: Attending: Physician Assistant | Admitting: Physician Assistant

## 2024-03-27 ENCOUNTER — Encounter: Payer: Self-pay | Admitting: Physician Assistant

## 2024-03-27 ENCOUNTER — Other Ambulatory Visit: Payer: Self-pay | Admitting: *Deleted

## 2024-03-27 VITALS — BP 146/80 | HR 77 | Ht <= 58 in | Wt 105.0 lb

## 2024-03-27 DIAGNOSIS — I361 Nonrheumatic tricuspid (valve) insufficiency: Secondary | ICD-10-CM | POA: Diagnosis not present

## 2024-03-27 DIAGNOSIS — Z79899 Other long term (current) drug therapy: Secondary | ICD-10-CM

## 2024-03-27 DIAGNOSIS — R030 Elevated blood-pressure reading, without diagnosis of hypertension: Secondary | ICD-10-CM

## 2024-03-27 DIAGNOSIS — I340 Nonrheumatic mitral (valve) insufficiency: Secondary | ICD-10-CM | POA: Diagnosis not present

## 2024-03-27 DIAGNOSIS — I2510 Atherosclerotic heart disease of native coronary artery without angina pectoris: Secondary | ICD-10-CM

## 2024-03-27 DIAGNOSIS — E7800 Pure hypercholesterolemia, unspecified: Secondary | ICD-10-CM

## 2024-03-27 DIAGNOSIS — R002 Palpitations: Secondary | ICD-10-CM

## 2024-03-27 MED ORDER — BLOOD PRESSURE MONITOR MISC
1.0000 | 0 refills | Status: AC
  Filled 2024-03-27: qty 1, 30d supply, fill #0

## 2024-03-27 MED ORDER — BLOOD PRESSURE KIT
1.0000 | PACK | 0 refills | Status: DC
  Filled 2024-03-27: qty 1, fill #0

## 2024-03-27 MED ORDER — ASPIRIN 81 MG PO TBEC: 81.0000 mg | DELAYED_RELEASE_TABLET | Freq: Every day | ORAL | Status: AC

## 2024-03-27 MED ORDER — EZETIMIBE 10 MG PO TABS
10.0000 mg | ORAL_TABLET | Freq: Every day | ORAL | 3 refills | Status: AC
  Filled 2024-03-27: qty 90, 90d supply, fill #0
  Filled 2024-06-26: qty 90, 90d supply, fill #1

## 2024-03-27 NOTE — Patient Instructions (Addendum)
 Medication Instructions:  Please start Zetia 10 mg a day. Please take Aspirin  81 mg a day. Continue all other medications as listed.  *If you need a refill on your cardiac medications before your next appointment, please call your pharmacy*  Lab Work: .please have Lipid Panel in 3 months  If you have labs (blood work) drawn today and your tests are completely normal, you will receive your results only by: MyChart Message (if you have MyChart) OR A paper copy in the mail If you have any lab test that is abnormal or we need to change your treatment, we will call you to review the results.  Testing/Procedures: Carla Alvarado- Long Term Monitor Instructions  Your physician has requested you wear a ZIO patch monitor for 14 days.  This is a single patch monitor. [employer] supplies one patch monitor per enrollment. Additional stickers are not available. Please do not apply patch if you will be having a Nuclear Stress Test,  Echocardiogram, Cardiac CT, MRI, or Chest Xray during the period you would be wearing the  monitor. The patch cannot be worn during these tests. You cannot remove and re-apply the  ZIO XT patch monitor.  Your ZIO patch monitor will be mailed 3 day USPS to your address on file. It may take 3-5 days  to receive your monitor after you have been enrolled.  Once you have received your monitor, please review the enclosed instructions. Your monitor  has already been registered assigning a specific monitor serial # to you.  Billing and Patient Assistance Program Information  We have supplied [employer] with any of your insurance information on file for billing purposes. [employer] offers a sliding scale Patient Assistance Program for patients that do not have  insurance, or whose insurance does not completely cover the cost of the ZIO monitor.  You must apply for the Patient Assistance Program to qualify for this discounted rate.  To apply, please call [employer] at [phone], select option  4, select option 2, ask to apply for  Patient Assistance Program. Meredeth will ask your household income, and how many people  are in your household. They will quote your out-of-pocket cost based on that information.  [employer] will also be able to set up a 14-month, interest-free payment plan if needed.  Applying the monitor   Shave hair from upper left chest.  Hold abrader disc by orange tab. Rub abrader in 40 strokes over the upper left chest as  indicated in your monitor instructions.  Clean area with 4 enclosed alcohol pads. Let dry.  Apply patch as indicated in monitor instructions. Patch will be placed under collarbone on left  side of chest with arrow pointing upward.  Rub patch adhesive wings for 2 minutes. Remove white label marked 1. Remove the white  label marked 2. Rub patch adhesive wings for 2 additional minutes.  While looking in a mirror, press and release button in center of patch. A small green light will  flash 3-4 times. This will be your only indicator that the monitor has been turned on.  Do not shower for the first 24 hours. You may shower after the first 24 hours.  Press the button if you feel a symptom. You will hear a small click. Record Date, Time and  Symptom in the Patient Logbook.  When you are ready to remove the patch, follow instructions on the last 2 pages of Patient  Logbook. Stick patch monitor onto the last page of Patient Logbook.  Place Patient  Logbook in the blue and white box. Use locking tab on box and tape box closed  securely. The blue and white box has prepaid postage on it. Please place it in the mailbox as  soon as possible. Your physician should have your test results approximately 7 days after the  monitor has been mailed back to [facility]  Call [facility] Customer Care at [phone] if you have questions regarding  your ZIO XT patch monitor. Call them immediately if you see an orange light blinking on your  monitor.  If  your monitor falls off in less than 4 days, contact our Monitor department at [phone].  If your monitor becomes loose or falls off after 4 days call [employer] at [phone] for  suggestions on securing your monitor   Please obtain a arm blood pressure cuff and monitor for 1 week.  Please notify the office if elevated.   Follow-Up: At [facility], you and your health needs are our priority.  As part of our continuing mission to provide you with exceptional heart care, our providers are all part of one team.  This team includes your primary Cardiologist (physician) and Advanced Practice Providers or APPs (Physician Assistants and Nurse Practitioners) who all work together to provide you with the care you need, when you need it.  Your next appointment:   6 month(s)  Provider:   Dr Kriste    We recommend signing up for the patient portal called MyChart.  Sign up information is provided on this After Visit Summary.  MyChart is used to connect with patients for Virtual Visits (Telemedicine).  Patients are able to view lab/test results, encounter notes, upcoming appointments, etc.  Non-urgent messages can be sent to your provider as well.   To learn more about what you can do with MyChart, go to [email or web address].

## 2024-04-11 NOTE — Telephone Encounter (Signed)
MyChart to patient

## 2024-05-05 ENCOUNTER — Ambulatory Visit: Admitting: Physician Assistant

## 2024-06-05 ENCOUNTER — Ambulatory Visit: Admitting: Neurology

## 2024-06-05 ENCOUNTER — Encounter: Payer: Self-pay | Admitting: Neurology

## 2024-06-05 VITALS — BP 130/78 | HR 68 | Ht <= 58 in | Wt 105.5 lb

## 2024-06-05 DIAGNOSIS — R202 Paresthesia of skin: Secondary | ICD-10-CM | POA: Diagnosis not present

## 2024-06-05 DIAGNOSIS — G43709 Chronic migraine without aura, not intractable, without status migrainosus: Secondary | ICD-10-CM | POA: Diagnosis not present

## 2024-06-05 NOTE — Progress Notes (Signed)
 Chief Complaint  Patient presents with   Consult    Pt in room 14. Alone. internal referral for Cervical radiculopathy.      ASSESSMENT AND PLAN  Carla Alvarado is a 78 y.o. female   Intermittent right arm paresthesia, deep muscle achy pain  Essentially normal neurological examination,  This happened in extreme stress,  ESR C-reactive protein to rule out inflammatory process,  As needed NSAIDs Chronic migraine  Responding well to Imitrex as needed,   DIAGNOSTIC DATA (LABS, IMAGING, TESTING) - I reviewed patient records, labs, notes, testing and imaging myself where available.   MEDICAL HISTORY:  Carla Alvarado, is a 78 year old right-handed female, seen in request by [facility] Dr. Nanci, Garnette evaluation of intermittent right arm paresthesia   History is obtained from the patient and review of electronic medical records. I personally reviewed pertinent available imaging films in PACS.   PMHx of  HLD Hypothyroidism Chronic migraine  CAD  She is under a lot of stress, she is the only caregiver of her son, who is in hospice care, suffered stage IV throat cancer, since September 2025, she noticed intermittent right arm paresthesia, starting from right hand involving all 5 fingers, spreading upwards to right shoulder, feels like electric pulse traveling upwards, lasting for couple minutes, there was no loss of consciousness, no right arm and right leg involvement, denies significant neck pain  She was having multiple episode in the day, now much improved, last episode was couple weeks ago  She complains of deep achy shoulder muscle pain, denies weakness  Long history of chronic migraine, responding to Imitrex as needed well, only mild mild obstructive chronic artery disease, no angina  Laboratory evaluation from [facility] August 2025, LDL 122, hemoglobin of 12.8, normal TSH one 0.4, normal liver function test,  PHYSICAL EXAM:   Vitals:   06/05/24 0817  BP: 130/78   Pulse: 68  SpO2: 98%  Weight: 105 lb 8 oz (47.9 kg)  Height: 4' 10 (1.473 m)   Body mass index is 22.05 kg/m.  PHYSICAL EXAMNIATION:  Gen: NAD, conversant, well nourised, well groomed                     Cardiovascular: Regular rate rhythm, no peripheral edema, warm, nontender. Eyes: Conjunctivae clear without exudates or hemorrhage Neck: Supple, no carotid bruits. Pulmonary: Clear to auscultation bilaterally   NEUROLOGICAL EXAM:  MENTAL STATUS: Speech/cognition: Awake, alert, oriented to history taking and casual conversation CRANIAL NERVES: CN II: Visual fields are full to confrontation. Pupils are round equal and briskly reactive to light. CN III, IV, VI: extraocular movement are normal. No ptosis. CN V: Facial sensation is intact to light touch CN VII: Face is symmetric with normal eye closure  CN VIII: Hearing is normal to causal conversation. CN IX, X: Phonation is normal. CN XI: Head turning and shoulder shrug are intact  MOTOR: There is no pronator drift of out-stretched arms. Muscle bulk and tone are normal. Muscle strength is normal.  Mild tenderness at right medial elbow  REFLEXES: Reflexes are 2+ and symmetric at the biceps, triceps, knees, and ankles. Plantar responses are flexor.  SENSORY: Intact to light touch, pinprick and vibratory sensation are intact in fingers and toes.  COORDINATION: There is no trunk or limb dysmetria noted.  GAIT/STANCE: Posture is normal. Gait is steady  REVIEW OF SYSTEMS:  Full 14 system review of systems performed and notable only for as above All other review of systems were negative.  ALLERGIES: Allergies[1]  HOME MEDICATIONS: Current Outpatient Medications  Medication Sig Dispense Refill   Apoaequorin (PREVAGEN PO) Take by mouth.     aspirin  EC 81 MG tablet Take 1 tablet (81 mg total) by mouth daily. Swallow whole.     Blood Pressure Monitor MISC Monitor blood pressure as directed. 1 each 0   clobetasol   ointment (TEMOVATE ) 0.05 % PLEASE SEE ATTACHED FOR DETAILED DIRECTIONS     cyclobenzaprine  (FLEXERIL ) 5 MG tablet Take 1 tablet (5 mg total) by mouth 2 (two) times daily as needed for muscle spasms. 15 tablet 0   diazepam (VALIUM) 2 MG tablet Take 2 mg by mouth at bedtime as needed for anxiety.     ezetimibe  (ZETIA ) 10 MG tablet Take 1 tablet (10 mg total) by mouth daily. 90 tablet 3   Ferrous Fumarate (IRON) 18 MG TBCR Take by mouth.     levothyroxine (SYNTHROID, LEVOTHROID) 88 MCG tablet Take 88 mcg by mouth daily before breakfast.     lovastatin (MEVACOR) 40 MG tablet Take 40 mg by mouth daily.     MAGNESIUM GLYCINATE PO Take 200 mg by mouth daily.     Multiple Vitamin (MULTIVITAMIN PO) Take by mouth.     Multiple Vitamins-Minerals (HAIR SKIN AND NAILS FORMULA PO) Take by mouth.     naproxen  (NAPROSYN ) 375 MG tablet Take 1 tablet (375 mg total) by mouth 2 (two) times daily. 20 tablet 0   NONFORMULARY OR COMPOUNDED ITEM Estradiol  vaginal cream 0.02% insert twice weekly 24 each 0   OVER THE COUNTER MEDICATION Calcium 600 mg, one capsule daily.     OVER THE COUNTER MEDICATION Vitamin D  3, one capsule daily.     OVER THE COUNTER MEDICATION Vitamin B 12, 2000 mcg. One tablet daily.     SUMAtriptan (IMITREX) 100 MG tablet Take 100 mg by mouth every 2 (two) hours as needed for migraine. May repeat in 2 hours if headache persists or recurs.     Zinc 100 MG TABS 1 tablet Orally Once a day for 30 day(s)     zolpidem (AMBIEN) 5 MG tablet Take 5 mg by mouth at bedtime as needed for sleep.     Current Facility-Administered Medications  Medication Dose Route Frequency Provider Last Rate Last Admin   denosumab  (PROLIA ) injection 60 mg  60 mg Subcutaneous Q6 months         PAST MEDICAL HISTORY: Past Medical History:  Diagnosis Date   Allergy    Anemia    Anxiety    Arthritis    CAD (coronary artery disease) 01/26/2024   CCTA 01/25/2024: CAC score 14.9 (33rd percentile), nonobstructive CAD [LAD 1-24,  LCx 1-24]    Cancer (HCC)    Melanoma   Depression    Family history of breast cancer    Family history of colon cancer    Family history of Lynch syndrome    Family history of prostate cancer    GERD (gastroesophageal reflux disease)    History of renal stone    HSV infection    oral   Hypercholesteremia    Hypothyroid    Lichen sclerosus    Migraine    Mitral regurgitation 02/24/2024   TTE 02/23/24: EF 60-65, no RWMA, NL RVSF, NL PASP, mild MR, mild to mod TR    Osteopenia 07/2018   T score -2.2 with slight loss at spine stable at the hips   Osteoporosis    Personal history of malignant melanoma    Pneumonia  Skin cancer 07/2015   Tricuspid regurgitation 02/24/2024   TTE 02/23/24: EF 60-65, no RWMA, NL RVSF, NL PASP, mild MR, mild to mod TR    Tubular adenoma of colon 01/2013    PAST SURGICAL HISTORY: Past Surgical History:  Procedure Laterality Date   BREAST EXCISIONAL BIOPSY Right    breast papilloma     CESAREAN SECTION     MELANOMA EXCISION WITH SENTINEL LYMPH NODE BIOPSY     VAGINAL HYSTERECTOMY  1990    FAMILY HISTORY: Family History  Problem Relation Age of Onset   Hypertension Mother    Colon cancer Mother        mets from breast cancer   Heart disease Mother    Breast cancer Mother        Age 66, mets all over    Colon polyps Sister    Breast cancer Sister 70       maternal half sister   Heart disease Brother    Breast cancer Maternal Aunt 41   Breast cancer Maternal Aunt        Age 67   Breast cancer Paternal Aunt        Age 16   Diabetes Maternal Grandmother    Heart disease Maternal Grandfather    Esophageal cancer Son    Throat cancer Son    Prostate cancer Cousin        maternal first cousin   Colon cancer Cousin        maternal first cousin   Cancer Cousin        NOS - maternal first cousin   Breast cancer Cousin 36       TN breast cancer, maternal first cousin   Breast cancer Other        Lynch syndrome   Rectal cancer Neg Hx     Stomach cancer Neg Hx     SOCIAL HISTORY: Social History   Socioeconomic History   Marital status: Divorced    Spouse name: [name]   Number of children: 2   Years of education: 12+   Highest education level: Not on file  Occupational History   Occupation: Retired  Tobacco Use   Smoking status: Never   Smokeless tobacco: Never  Vaping Use   Vaping status: Never Used  Substance and Sexual Activity   Alcohol use: Yes    Comment: occassionally   Drug use: No   Sexual activity: Not Currently    Partners: Male    Birth control/protection: Surgical, Post-menopausal    Comment: 1st intercourse 78 yo-Fewer than 5 partners, hysterectomy  Other Topics Concern   Not on file  Social History Narrative   Patient lives at home with Husband [name]    Patient has 2 children.    Patient works at [employer].    Patient has 2 years of college.    Patient is right handed         Social Drivers of Health   Tobacco Use: Low Risk (06/05/2024)   Patient History    Smoking Tobacco Use: Never    Smokeless Tobacco Use: Never    Passive Exposure: Not on file  Financial Resource Strain: Not on file  Food Insecurity: Not on file  Transportation Needs: Not on file  Physical Activity: Not on file  Stress: Not on file  Social Connections: Not on file  Intimate Partner Violence: Not on file  Depression (EYV7-0): Not on file  Alcohol Screen: Not on file  Housing: Not  on file  Utilities: Not on file  Health Literacy: Not on file      Modena Callander, M.D. Ph.D.  [facility] [address] [place], [place] [address] Ph: [phone] Fax: [phone]  CC:  Nanci Senior, MD [address] [place],  [place] [address]  Nanci Senior, MD      [1]  Allergies Allergen Reactions   Morphine  And Codeine Nausea And Vomiting    Increased heart rate   Atorvastatin     Other reaction(s): myalgias   Levothyroxine Sodium Hives    Generic brand   Sertraline Hcl      Other reaction(s): weight loss; shakiness

## 2024-06-06 ENCOUNTER — Ambulatory Visit: Payer: Self-pay | Admitting: Neurology

## 2024-06-06 LAB — C-REACTIVE PROTEIN: CRP: 3 mg/L (ref 0–10)

## 2024-06-06 LAB — ANA W/REFLEX IF POSITIVE: Anti Nuclear Antibody (ANA): NEGATIVE

## 2024-06-06 LAB — SEDIMENTATION RATE: Sed Rate: 2 mm/h (ref 0–40)

## 2024-06-26 ENCOUNTER — Other Ambulatory Visit (HOSPITAL_COMMUNITY): Payer: Self-pay

## 2024-07-06 ENCOUNTER — Telehealth: Payer: Self-pay | Admitting: Neurology

## 2024-07-06 DIAGNOSIS — G43709 Chronic migraine without aura, not intractable, without status migrainosus: Secondary | ICD-10-CM

## 2024-07-06 DIAGNOSIS — R202 Paresthesia of skin: Secondary | ICD-10-CM

## 2024-07-06 NOTE — Telephone Encounter (Signed)
 Dr.Yan I called patient and explained the below. Pt was seen on 06/05/24 for Paresthesia in right arm into right hand, pt said this still happens but just not as bad.  She is now having same symptoms in left hand that started about 1 week ago, she thought you told her its not nerve related.   Patient is concerned and would like to know what/why she is having this feeling. If not nerve related, what can she do or who can she see?  Please advise

## 2024-07-06 NOTE — Telephone Encounter (Signed)
 Orders Placed This Encounter  Procedures   MR BRAIN WO CONTRAST   MR CERVICAL SPINE WO CONTRAST    I called patient, she is doing a lot of stress taking care of her son, who suffered cancer, she complains of intermittent pulsating sensation down her arm, worry about structural abnormality would like to proceed with further imaging testing, ordered MRI of the brain and cervical spine

## 2024-07-06 NOTE — Telephone Encounter (Signed)
 MRI orders sent to [facility] to schedule. [phone]

## 2024-07-06 NOTE — Telephone Encounter (Signed)
 Pt asking for a call from Dr Onita re: last appointment.  Pt states she has a pulse thing going up and down her arm and a nerve issue in hand.  Pt is asking for a call to discuss greater what Dr Onita thinks her issue is and who she would recommend pt see's for treatment.

## 2024-07-11 ENCOUNTER — Encounter: Payer: Self-pay | Admitting: Neurology

## 2024-07-19 ENCOUNTER — Other Ambulatory Visit

## 2024-07-20 ENCOUNTER — Ambulatory Visit
Admission: RE | Admit: 2024-07-20 | Discharge: 2024-07-20 | Disposition: A | Discharge status: Home / Self Care | Source: Ambulatory Visit | Attending: Neurology | Admitting: Neurology

## 2024-07-20 ENCOUNTER — Ambulatory Visit: Payer: Self-pay | Admitting: Neurology

## 2024-07-20 DIAGNOSIS — R202 Paresthesia of skin: Secondary | ICD-10-CM

## 2024-07-20 DIAGNOSIS — G43709 Chronic migraine without aura, not intractable, without status migrainosus: Secondary | ICD-10-CM

## 2024-09-12 ENCOUNTER — Ambulatory Visit: Admitting: Internal Medicine

## 2024-10-03 ENCOUNTER — Ambulatory Visit: Admitting: Internal Medicine

## 2024-11-16 ENCOUNTER — Encounter: Admitting: Obstetrics and Gynecology
